# Patient Record
Sex: Male | Born: 1955 | Hispanic: No | Marital: Married | Smoking: Never smoker
Health system: Southern US, Community
[De-identification: ages and names within clinical notes are randomized; demographics above are authoritative.]

## PROBLEM LIST (undated history)

## (undated) DIAGNOSIS — J95851 Ventilator associated pneumonia: Secondary | ICD-10-CM

## (undated) DIAGNOSIS — J9621 Acute and chronic respiratory failure with hypoxia: Secondary | ICD-10-CM

## (undated) DIAGNOSIS — I82409 Acute embolism and thrombosis of unspecified deep veins of unspecified lower extremity: Secondary | ICD-10-CM

## (undated) DIAGNOSIS — G4730 Sleep apnea, unspecified: Secondary | ICD-10-CM

## (undated) DIAGNOSIS — F419 Anxiety disorder, unspecified: Secondary | ICD-10-CM

## (undated) DIAGNOSIS — E119 Type 2 diabetes mellitus without complications: Secondary | ICD-10-CM

## (undated) DIAGNOSIS — U071 COVID-19: Secondary | ICD-10-CM

## (undated) DIAGNOSIS — K219 Gastro-esophageal reflux disease without esophagitis: Secondary | ICD-10-CM

## (undated) DIAGNOSIS — I10 Essential (primary) hypertension: Secondary | ICD-10-CM

## (undated) DIAGNOSIS — J80 Acute respiratory distress syndrome: Secondary | ICD-10-CM

## (undated) DIAGNOSIS — K529 Noninfective gastroenteritis and colitis, unspecified: Secondary | ICD-10-CM

## (undated) HISTORY — DX: Sleep apnea, unspecified: G47.30

## (undated) HISTORY — DX: Gastro-esophageal reflux disease without esophagitis: K21.9

## (undated) HISTORY — DX: Anxiety disorder, unspecified: F41.9

## (undated) HISTORY — DX: Acute embolism and thrombosis of unspecified deep veins of unspecified lower extremity: I82.409

## (undated) HISTORY — DX: Type 2 diabetes mellitus without complications: E11.9

## (undated) HISTORY — DX: Noninfective gastroenteritis and colitis, unspecified: K52.9

## (undated) HISTORY — DX: Essential (primary) hypertension: I10

---

## 2011-05-02 DIAGNOSIS — E119 Type 2 diabetes mellitus without complications: Secondary | ICD-10-CM | POA: Insufficient documentation

## 2011-12-19 DIAGNOSIS — G5792 Unspecified mononeuropathy of left lower limb: Secondary | ICD-10-CM | POA: Insufficient documentation

## 2016-10-19 DIAGNOSIS — I10 Essential (primary) hypertension: Secondary | ICD-10-CM | POA: Insufficient documentation

## 2016-11-23 DIAGNOSIS — K5190 Ulcerative colitis, unspecified, without complications: Secondary | ICD-10-CM | POA: Insufficient documentation

## 2020-10-13 ENCOUNTER — Other Ambulatory Visit (HOSPITAL_COMMUNITY): Payer: Medicare Other

## 2020-10-13 ENCOUNTER — Institutional Professional Consult (permissible substitution)
Admission: RE | Admit: 2020-10-13 | Discharge: 2020-11-22 | Disposition: A | Discharge status: Skilled Nursing Facility | Payer: Medicare Other | Attending: Internal Medicine | Admitting: Internal Medicine

## 2020-10-13 DIAGNOSIS — I509 Heart failure, unspecified: Secondary | ICD-10-CM

## 2020-10-13 DIAGNOSIS — J80 Acute respiratory distress syndrome: Secondary | ICD-10-CM | POA: Diagnosis present

## 2020-10-13 DIAGNOSIS — M25552 Pain in left hip: Secondary | ICD-10-CM

## 2020-10-13 DIAGNOSIS — R52 Pain, unspecified: Secondary | ICD-10-CM

## 2020-10-13 DIAGNOSIS — Z8709 Personal history of other diseases of the respiratory system: Secondary | ICD-10-CM

## 2020-10-13 DIAGNOSIS — J9690 Respiratory failure, unspecified, unspecified whether with hypoxia or hypercapnia: Secondary | ICD-10-CM

## 2020-10-13 DIAGNOSIS — U071 COVID-19: Secondary | ICD-10-CM

## 2020-10-13 DIAGNOSIS — J9621 Acute and chronic respiratory failure with hypoxia: Secondary | ICD-10-CM | POA: Diagnosis present

## 2020-10-13 DIAGNOSIS — J95851 Ventilator associated pneumonia: Secondary | ICD-10-CM | POA: Diagnosis present

## 2020-10-13 DIAGNOSIS — Z931 Gastrostomy status: Secondary | ICD-10-CM

## 2020-10-13 DIAGNOSIS — K567 Ileus, unspecified: Secondary | ICD-10-CM

## 2020-10-13 DIAGNOSIS — R0902 Hypoxemia: Secondary | ICD-10-CM

## 2020-10-13 DIAGNOSIS — N201 Calculus of ureter: Secondary | ICD-10-CM

## 2020-10-13 DIAGNOSIS — J189 Pneumonia, unspecified organism: Secondary | ICD-10-CM

## 2020-10-13 DIAGNOSIS — M25551 Pain in right hip: Secondary | ICD-10-CM

## 2020-10-13 HISTORY — DX: Ventilator associated pneumonia: J95.851

## 2020-10-13 HISTORY — DX: COVID-19: U07.1

## 2020-10-13 HISTORY — DX: Acute respiratory distress syndrome: J80

## 2020-10-13 HISTORY — DX: Acute and chronic respiratory failure with hypoxia: J96.21

## 2020-10-13 LAB — BLOOD GAS, ARTERIAL
Acid-Base Excess: 10 mmol/L — ABNORMAL HIGH (ref 0.0–2.0)
Bicarbonate: 36.3 mmol/L — ABNORMAL HIGH (ref 20.0–28.0)
FIO2: 75
O2 Saturation: 92 %
Patient temperature: 37.7
pCO2 arterial: 76.5 mmHg (ref 32.0–48.0)
pH, Arterial: 7.302 — ABNORMAL LOW (ref 7.350–7.450)
pO2, Arterial: 76.4 mmHg — ABNORMAL LOW (ref 83.0–108.0)

## 2020-10-14 ENCOUNTER — Other Ambulatory Visit (HOSPITAL_COMMUNITY): Payer: Medicare Other

## 2020-10-14 LAB — CBC
HCT: 35.1 % — ABNORMAL LOW (ref 39.0–52.0)
Hemoglobin: 10.5 g/dL — ABNORMAL LOW (ref 13.0–17.0)
MCH: 26.6 pg (ref 26.0–34.0)
MCHC: 29.9 g/dL — ABNORMAL LOW (ref 30.0–36.0)
MCV: 88.9 fL (ref 80.0–100.0)
Platelets: 276 10*3/uL (ref 150–400)
RBC: 3.95 MIL/uL — ABNORMAL LOW (ref 4.22–5.81)
RDW: 14.8 % (ref 11.5–15.5)
WBC: 13.9 10*3/uL — ABNORMAL HIGH (ref 4.0–10.5)
nRBC: 0 % (ref 0.0–0.2)

## 2020-10-14 LAB — BASIC METABOLIC PANEL
Anion gap: 10 (ref 5–15)
BUN: 20 mg/dL (ref 8–23)
CO2: 34 mmol/L — ABNORMAL HIGH (ref 22–32)
Calcium: 9.3 mg/dL (ref 8.9–10.3)
Chloride: 97 mmol/L — ABNORMAL LOW (ref 98–111)
Creatinine, Ser: 0.75 mg/dL (ref 0.61–1.24)
GFR, Estimated: >60 mL/min (ref >60)
Glucose, Bld: 175 mg/dL — ABNORMAL HIGH (ref 70–99)
Potassium: 4.2 mmol/L (ref 3.5–5.1)
Sodium: 141 mmol/L (ref 135–145)

## 2020-10-14 LAB — TRIGLYCERIDES: Triglycerides: 171 mg/dL — ABNORMAL HIGH (ref <150)

## 2020-10-15 ENCOUNTER — Other Ambulatory Visit (HOSPITAL_COMMUNITY): Payer: Medicare Other

## 2020-10-15 ENCOUNTER — Encounter: Payer: Self-pay | Admitting: Internal Medicine

## 2020-10-15 DIAGNOSIS — J9621 Acute and chronic respiratory failure with hypoxia: Secondary | ICD-10-CM | POA: Diagnosis present

## 2020-10-15 DIAGNOSIS — J95851 Ventilator associated pneumonia: Secondary | ICD-10-CM | POA: Diagnosis present

## 2020-10-15 DIAGNOSIS — J80 Acute respiratory distress syndrome: Secondary | ICD-10-CM | POA: Diagnosis present

## 2020-10-15 DIAGNOSIS — U071 COVID-19: Secondary | ICD-10-CM | POA: Diagnosis present

## 2020-10-15 NOTE — Consult Note (Signed)
Pulmonary Critical Care Medicine [facility] [place]  PULMONARY SERVICE  Date of Service: 10/15/2020  PULMONARY CRITICAL CARE Jamesrobert Ohanesian  OEU:[id number]  DOB: 20-Oct-1955   DOA: 10/13/2020  Referring Physician: Carron Curie, MD  Collin Robertson is a 65 y.o. male seen for follow up of Acute on Chronic Respiratory Failure. Patient has multiple medical problems including hypertension diabetes sleep apnea who presents to the hospital because of increasing shortness of breath.  The patient at the time of evaluation was found to be Covid positive and had been not vaccinated.  Patient was started on remdesivir as well as Decadron.  Patient however decompensated had to be intubated placed on mechanical ventilation.  Subsequently attempts to wean failed and tracheostomy was performed now presents to our hospital for further management and weaning  Review of Systems:  ROS performed and is unremarkable other than noted above.  Past medical history: Hypertension Diabetes Sleep apnea COVID-19  Past surgical history: Tracheostomy PEG  Social history: Noncontributory  Family history: Noncontributory to the present illness  Medications: Reviewed on Rounds  Physical Exam:  Vitals: Temperature is 98.2 pulse 102 respiratory 28 blood pressure is 137/81 saturations 95%  Ventilator Settings on assist control FiO2 60% tidal volume 500 PEEP of 5  . General: Comfortable at this time . Eyes: Grossly normal lids, irises & conjunctiva . ENT: grossly tongue is normal . Neck: no obvious mass . Cardiovascular: S1-S2 normal no gallop or rub . Respiratory: Scattered coarse rhonchi are noted bilaterally. . Abdomen: Soft nontender . Skin: no rash seen on limited exam . Musculoskeletal: not rigid . Psychiatric:unable to assess . Neurologic: no seizure no involuntary movements         Labs on Admission:  Basic Metabolic Panel: Recent Labs  Lab 10/14/20 0413  NA 141  K  4.2  CL 97*  CO2 34*  GLUCOSE 175*  BUN 20  CREATININE 0.75  CALCIUM 9.3    Recent Labs  Lab 10/13/20 2210  PHART 7.302*  PCO2ART 76.5*  PO2ART 76.4*  HCO3 36.3*  O2SAT 92.0    Liver Function Tests: No results for input(s): AST, ALT, ALKPHOS, BILITOT, PROT, ALBUMIN in the last 168 hours. No results for input(s): LIPASE, AMYLASE in the last 168 hours. No results for input(s): AMMONIA in the last 168 hours.  CBC: Recent Labs  Lab 10/14/20 0413  WBC 13.9*  HGB 10.5*  HCT 35.1*  MCV 88.9  PLT 276    Cardiac Enzymes: No results for input(s): CKTOTAL, CKMB, CKMBINDEX, TROPONINI in the last 168 hours.  BNP (last 3 results) No results for input(s): BNP in the last 8760 hours.  ProBNP (last 3 results) No results for input(s): PROBNP in the last 8760 hours.   Radiological Exams on Admission: DG ABDOMEN PEG TUBE LOCATION  Result Date: 10/13/2020 CLINICAL DATA:  Peg tube placement EXAM: ABDOMEN - 1 VIEW COMPARISON:  None. FINDINGS: Gastrostomy tube projects over the proximal stomach. Contrast pooling within the fundus. No gross extravasation is seen. IMPRESSION: Gastrostomy tube projects over the proximal stomach. No gross extravasation. Electronically Signed   By: Jasmine Pang M.D.   On: 10/13/2020 23:31   DG CHEST PORT 1 VIEW  Result Date: 10/14/2020 CLINICAL DATA:  Respiratory failure EXAM: PORTABLE CHEST 1 VIEW COMPARISON:  None. FINDINGS: Tracheostomy tube with tip centered over the upper thoracic trachea. Left upper extremity PICC with tip overlying the superior cavoatrial junction. The cardiac silhouette is partially obscured but appears enlarged, likely accentuated  by technique. Low lung volumes with diffuse interstitial opacities and bibasilar airspace opacities. The visualized skeletal structures are unremarkable. IMPRESSION: 1. Low lung volumes with diffuse interstitial opacities and bibasilar airspace opacities, most consistent with pulmonary edema. Multifocal  infection is not excluded. Electronically Signed   By: Maudry Mayhew MD   On: 10/14/2020 14:41    Assessment/Plan Active Problems:   Acute on chronic respiratory failure with hypoxia (HCC)   COVID-19 virus infection   Ventilator associated pneumonia (HCC)   Acute respiratory distress syndrome (ARDS) due to COVID-19 virus (HCC)   1. Acute on chronic respiratory failure hypoxia patient has been gradually weaning trying to monitor the patient's secretions this remains a limiting factor right now. 2. COVID-19 virus infection in recovery we will continue to monitor closely. 3. Ventilator associated pneumonia has been treated on antibiotics and cefepime 4. ARDS secondary to COVID-19 slow to resolution patient has residual pulmonary deficits noted  I have personally seen and evaluated the patient, evaluated laboratory and imaging results, formulated the assessment and plan and placed orders. The Patient requires high complexity decision making with multiple systems involvement.  Case was discussed on Rounds with the Respiratory Therapy Director and the Respiratory staff Time Spent  Yevonne Pax, MD [facility] Pulmonary Critical Care Medicine Sleep Medicine

## 2020-10-16 DIAGNOSIS — J95851 Ventilator associated pneumonia: Secondary | ICD-10-CM | POA: Diagnosis not present

## 2020-10-16 DIAGNOSIS — U071 COVID-19: Secondary | ICD-10-CM | POA: Diagnosis not present

## 2020-10-16 DIAGNOSIS — J9621 Acute and chronic respiratory failure with hypoxia: Secondary | ICD-10-CM | POA: Diagnosis not present

## 2020-10-16 DIAGNOSIS — J80 Acute respiratory distress syndrome: Secondary | ICD-10-CM | POA: Diagnosis not present

## 2020-10-16 LAB — BASIC METABOLIC PANEL
Anion gap: 9 (ref 5–15)
BUN: 34 mg/dL — ABNORMAL HIGH (ref 8–23)
CO2: 36 mmol/L — ABNORMAL HIGH (ref 22–32)
Calcium: 9.2 mg/dL (ref 8.9–10.3)
Chloride: 95 mmol/L — ABNORMAL LOW (ref 98–111)
Creatinine, Ser: 0.8 mg/dL (ref 0.61–1.24)
GFR, Estimated: >60 mL/min (ref >60)
Glucose, Bld: 216 mg/dL — ABNORMAL HIGH (ref 70–99)
Potassium: 4.2 mmol/L (ref 3.5–5.1)
Sodium: 140 mmol/L (ref 135–145)

## 2020-10-16 LAB — POTASSIUM: Potassium: 5.5 mmol/L — ABNORMAL HIGH (ref 3.5–5.1)

## 2020-10-16 LAB — MAGNESIUM: Magnesium: 1.9 mg/dL (ref 1.7–2.4)

## 2020-10-16 NOTE — Progress Notes (Signed)
Pulmonary Critical Care Medicine [facility] [place]   PULMONARY CRITICAL CARE SERVICE  PROGRESS NOTE  Date of Service: 10/16/2020  Collin Robertson  FVC:[id number]  DOB: [birth year]   DOA: 10/13/2020  Referring Physician: Carron Curie, MD  HPI: Collin Robertson is a 65 y.o. male seen for follow up of Acute on Chronic Respiratory Failure.  Patient right now is on full support had some issues with bleeding around the tracheostomy now seems to be a little bit better  Medications: Reviewed on Rounds  Physical Exam:  Vitals: Temperature 98.1 pulse 110 respiratory rate is 31 blood pressure 149/94 saturations 95%  Ventilator Settings on assist control FiO2 60% tidal volume 500 PEEP 5  . General: Comfortable at this time . Eyes: Grossly normal lids, irises & conjunctiva . ENT: grossly tongue is normal . Neck: no obvious mass . Cardiovascular: S1 S2 normal no gallop . Respiratory: Scattered rhonchi coarse breath sounds . Abdomen: soft . Skin: no rash seen on limited exam . Musculoskeletal: not rigid . Psychiatric:unable to assess . Neurologic: no seizure no involuntary movements         Lab Data:   Basic Metabolic Panel: Recent Labs  Lab 10/14/20 0413  NA 141  K 4.2  CL 97*  CO2 34*  GLUCOSE 175*  BUN 20  CREATININE 0.75  CALCIUM 9.3    ABG: Recent Labs  Lab 10/13/20 2210  PHART 7.302*  PCO2ART 76.5*  PO2ART 76.4*  HCO3 36.3*  O2SAT 92.0    Liver Function Tests: No results for input(s): AST, ALT, ALKPHOS, BILITOT, PROT, ALBUMIN in the last 168 hours. No results for input(s): LIPASE, AMYLASE in the last 168 hours. No results for input(s): AMMONIA in the last 168 hours.  CBC: Recent Labs  Lab 10/14/20 0413  WBC 13.9*  HGB 10.5*  HCT 35.1*  MCV 88.9  PLT 276    Cardiac Enzymes: No results for input(s): CKTOTAL, CKMB, CKMBINDEX, TROPONINI in the last 168 hours.  BNP (last 3 results) No results for input(s): BNP in the last 8760 hours.  ProBNP  (last 3 results) No results for input(s): PROBNP in the last 8760 hours.  Radiological Exams: DG CHEST PORT 1 VIEW  Result Date: 10/14/2020 CLINICAL DATA:  Respiratory failure EXAM: PORTABLE CHEST 1 VIEW COMPARISON:  None. FINDINGS: Tracheostomy tube with tip centered over the upper thoracic trachea. Left upper extremity PICC with tip overlying the superior cavoatrial junction. The cardiac silhouette is partially obscured but appears enlarged, likely accentuated by technique. Low lung volumes with diffuse interstitial opacities and bibasilar airspace opacities. The visualized skeletal structures are unremarkable. IMPRESSION: 1. Low lung volumes with diffuse interstitial opacities and bibasilar airspace opacities, most consistent with pulmonary edema. Multifocal infection is not excluded. Electronically Signed   By: Maudry Mayhew MD   On: 10/14/2020 14:41   DG Abd Portable 1V  Result Date: 10/15/2020 CLINICAL DATA:  Ileus EXAM: PORTABLE ABDOMEN - 1 VIEW COMPARISON:  10/13/2020 FINDINGS: Gastrostomy remains in place. Gas is present throughout the colon suggesting ileus. Contrast fills the rectal region. Small bowel pattern is unremarkable. Patchy bilateral pulmonary infiltrates are appreciable. IMPRESSION: Gas present throughout the colon suggesting ileus. Contrast fills the rectal region. Small bowel pattern is unremarkable. Gastrostomy in place. Electronically Signed   By: Paulina Fusi M.D.   On: 10/15/2020 21:01    Assessment/Plan Active Problems:   Acute on chronic respiratory failure with hypoxia (HCC)   COVID-19 virus infection   Ventilator associated pneumonia (HCC)   Acute  respiratory distress syndrome (ARDS) due to COVID-19 virus (HCC)   1. Acute on chronic respiratory failure hypoxia we will continue with on full support on the ventilator holding off on weaning right now 2. COVID-19 virus infection in recovery 3. Ventilator associated pneumonia treated slow improvement 4. ARDS  treated slow improvement   I have personally seen and evaluated the patient, evaluated laboratory and imaging results, formulated the assessment and plan and placed orders. The Patient requires high complexity decision making with multiple systems involvement.  Rounds were done with the Respiratory Therapy Director and Staff therapists and discussed with nursing staff also.  Yevonne Pax, MD [facility] Pulmonary Critical Care Medicine Sleep Medicine

## 2020-10-17 DIAGNOSIS — J9621 Acute and chronic respiratory failure with hypoxia: Secondary | ICD-10-CM | POA: Diagnosis not present

## 2020-10-17 DIAGNOSIS — J95851 Ventilator associated pneumonia: Secondary | ICD-10-CM | POA: Diagnosis not present

## 2020-10-17 DIAGNOSIS — J80 Acute respiratory distress syndrome: Secondary | ICD-10-CM | POA: Diagnosis not present

## 2020-10-17 DIAGNOSIS — U071 COVID-19: Secondary | ICD-10-CM | POA: Diagnosis not present

## 2020-10-17 LAB — BASIC METABOLIC PANEL
Anion gap: 7 (ref 5–15)
BUN: 31 mg/dL — ABNORMAL HIGH (ref 8–23)
CO2: 39 mmol/L — ABNORMAL HIGH (ref 22–32)
Calcium: 9.2 mg/dL (ref 8.9–10.3)
Chloride: 97 mmol/L — ABNORMAL LOW (ref 98–111)
Creatinine, Ser: 0.72 mg/dL (ref 0.61–1.24)
GFR, Estimated: >60 mL/min (ref >60)
Glucose, Bld: 150 mg/dL — ABNORMAL HIGH (ref 70–99)
Potassium: 4.2 mmol/L (ref 3.5–5.1)
Sodium: 143 mmol/L (ref 135–145)

## 2020-10-17 LAB — TRIGLYCERIDES: Triglycerides: 138 mg/dL (ref <150)

## 2020-10-17 NOTE — Progress Notes (Signed)
Pulmonary Critical Care Medicine [facility] [place]   PULMONARY CRITICAL CARE SERVICE  PROGRESS NOTE  Date of Service: 10/17/2020  Pavlos Yon  ZJQ:[id number]  DOB: 02-09-1956   DOA: 10/13/2020  Referring Physician: Carron Curie, MD  HPI: Markise Haymer is a 65 y.o. male seen for follow up of Acute on Chronic Respiratory Failure.  Patient is comfortable right now without distress remains on assist control mode has been on 50% FiO2  Medications: Reviewed on Rounds  Physical Exam:  Vitals: Temperature is 97.8 pulse 88 respiratory 28 blood pressure is 138/79 saturations 100%  Ventilator Settings on assist control FiO2 is 50% PEEP of 7 tidal volume 500  . General: Comfortable at this time . Eyes: Grossly normal lids, irises & conjunctiva . ENT: grossly tongue is normal . Neck: no obvious mass . Cardiovascular: S1 S2 normal no gallop . Respiratory: Scattered rhonchi expansion is equal . Abdomen: soft . Skin: no rash seen on limited exam . Musculoskeletal: not rigid . Psychiatric:unable to assess . Neurologic: no seizure no involuntary movements         Lab Data:   Basic Metabolic Panel: Recent Labs  Lab 10/14/20 0413 10/16/20 1411 10/16/20 1818 10/17/20 0442  NA 141 CANCELLED BY LAB 140 143  K 4.2 CANCELLED BY LAB  5.5* 4.2 4.2  CL 97* CANCELLED BY LAB 95* 97*  CO2 34* CANCELLED BY LAB 36* 39*  GLUCOSE 175* CANCELLED BY LAB 216* 150*  BUN 20 CANCELLED BY LAB 34* 31*  CREATININE 0.75 CANCELLED BY LAB 0.80 0.72  CALCIUM 9.3 CANCELLED BY LAB 9.2 9.2  MG  --  1.9  --   --     ABG: Recent Labs  Lab 10/13/20 2210  PHART 7.302*  PCO2ART 76.5*  PO2ART 76.4*  HCO3 36.3*  O2SAT 92.0    Liver Function Tests: No results for input(s): AST, ALT, ALKPHOS, BILITOT, PROT, ALBUMIN in the last 168 hours. No results for input(s): LIPASE, AMYLASE in the last 168 hours. No results for input(s): AMMONIA in the last 168 hours.  CBC: Recent Labs  Lab  10/14/20 0413  WBC 13.9*  HGB 10.5*  HCT 35.1*  MCV 88.9  PLT 276    Cardiac Enzymes: No results for input(s): CKTOTAL, CKMB, CKMBINDEX, TROPONINI in the last 168 hours.  BNP (last 3 results) No results for input(s): BNP in the last 8760 hours.  ProBNP (last 3 results) No results for input(s): PROBNP in the last 8760 hours.  Radiological Exams: DG Abd Portable 1V  Result Date: 10/15/2020 CLINICAL DATA:  Ileus EXAM: PORTABLE ABDOMEN - 1 VIEW COMPARISON:  10/13/2020 FINDINGS: Gastrostomy remains in place. Gas is present throughout the colon suggesting ileus. Contrast fills the rectal region. Small bowel pattern is unremarkable. Patchy bilateral pulmonary infiltrates are appreciable. IMPRESSION: Gas present throughout the colon suggesting ileus. Contrast fills the rectal region. Small bowel pattern is unremarkable. Gastrostomy in place. Electronically Signed   By: Paulina Fusi M.D.   On: 10/15/2020 21:01    Assessment/Plan Active Problems:   Acute on chronic respiratory failure with hypoxia (HCC)   COVID-19 virus infection   Ventilator associated pneumonia (HCC)   Acute respiratory distress syndrome (ARDS) due to COVID-19 virus (HCC)   1. Acute on chronic respiratory failure hypoxia we will continue with assist control titrate oxygen continue pulmonary toilet. 2. COVID-19 virus infection recovery 3. Ventilator associated pneumonia treated 4. ARDS treated slowly improving we will continue to follow along   I have personally seen and  evaluated the patient, evaluated laboratory and imaging results, formulated the assessment and plan and placed orders. The Patient requires high complexity decision making with multiple systems involvement.  Rounds were done with the Respiratory Therapy Director and Staff therapists and discussed with nursing staff also.  Allyne Gee, MD [facility] Pulmonary Critical Care Medicine Sleep Medicine

## 2020-10-18 DIAGNOSIS — J80 Acute respiratory distress syndrome: Secondary | ICD-10-CM | POA: Diagnosis not present

## 2020-10-18 DIAGNOSIS — J95851 Ventilator associated pneumonia: Secondary | ICD-10-CM | POA: Diagnosis not present

## 2020-10-18 DIAGNOSIS — J9621 Acute and chronic respiratory failure with hypoxia: Secondary | ICD-10-CM | POA: Diagnosis not present

## 2020-10-18 DIAGNOSIS — U071 COVID-19: Secondary | ICD-10-CM | POA: Diagnosis not present

## 2020-10-18 NOTE — Progress Notes (Signed)
Pulmonary Critical Care Medicine [facility] [place]   PULMONARY CRITICAL CARE SERVICE  PROGRESS NOTE  Date of Service: 10/18/2020  Collin Robertson  [id number]  DOB: [birth year]   DOA: 10/13/2020  Referring Physician: Carron Curie, MD  HPI: Collin Robertson is a 65 y.o. male seen for follow up of Acute on Chronic Respiratory Failure.  Patient has a low-grade fever noted at this time otherwise comfortable without distress remains on assist control mode  Medications: Reviewed on Rounds  Physical Exam:  Vitals: Temperature is 99.0 pulse 103 respiratory rate is 30 blood pressure is 136/81 saturations 95%  Ventilator Settings patient is currently on assist control FiO2 45% tidal volume 500 PEEP of 7  . General: Comfortable at this time . Eyes: Grossly normal lids, irises & conjunctiva . ENT: grossly tongue is normal . Neck: no obvious mass . Cardiovascular: S1 S2 normal no gallop . Respiratory: No rhonchi very coarse breath sounds . Abdomen: soft . Skin: no rash seen on limited exam . Musculoskeletal: not rigid . Psychiatric:unable to assess . Neurologic: no seizure no involuntary movements         Lab Data:   Basic Metabolic Panel: Recent Labs  Lab 10/14/20 0413 10/16/20 1411 10/16/20 1818 10/17/20 0442  NA 141 CANCELLED BY LAB 140 143  K 4.2 CANCELLED BY LAB  5.5* 4.2 4.2  CL 97* CANCELLED BY LAB 95* 97*  CO2 34* CANCELLED BY LAB 36* 39*  GLUCOSE 175* CANCELLED BY LAB 216* 150*  BUN 20 CANCELLED BY LAB 34* 31*  CREATININE 0.75 CANCELLED BY LAB 0.80 0.72  CALCIUM 9.3 CANCELLED BY LAB 9.2 9.2  MG  --  1.9  --   --     ABG: Recent Labs  Lab 10/13/20 2210  PHART 7.302*  PCO2ART 76.5*  PO2ART 76.4*  HCO3 36.3*  O2SAT 92.0    Liver Function Tests: No results for input(s): AST, ALT, ALKPHOS, BILITOT, PROT, ALBUMIN in the last 168 hours. No results for input(s): LIPASE, AMYLASE in the last 168 hours. No results for input(s): AMMONIA in the last 168  hours.  CBC: Recent Labs  Lab 10/14/20 0413  WBC 13.9*  HGB 10.5*  HCT 35.1*  MCV 88.9  PLT 276    Cardiac Enzymes: No results for input(s): CKTOTAL, CKMB, CKMBINDEX, TROPONINI in the last 168 hours.  BNP (last 3 results) No results for input(s): BNP in the last 8760 hours.  ProBNP (last 3 results) No results for input(s): PROBNP in the last 8760 hours.  Radiological Exams: No results found.  Assessment/Plan Active Problems:   Acute on chronic respiratory failure with hypoxia (HCC)   COVID-19 virus infection   Ventilator associated pneumonia (HCC)   Acute respiratory distress syndrome (ARDS) due to COVID-19 virus (HCC)   1. Acute on chronic respiratory failure hypoxia patient is comfortable right now on assist control mode supposed to wean on pressure support wean today 2. COVID-19 virus infection recovery we will continue to follow 3. Ventilator associated pneumonia treated 4. ARDS slow improvement   I have personally seen and evaluated the patient, evaluated laboratory and imaging results, formulated the assessment and plan and placed orders. The Patient requires high complexity decision making with multiple systems involvement.  Rounds were done with the Respiratory Therapy Director and Staff therapists and discussed with nursing staff also.  Yevonne Pax, MD [facility] Pulmonary Critical Care Medicine Sleep Medicine

## 2020-10-19 ENCOUNTER — Other Ambulatory Visit (HOSPITAL_COMMUNITY): Payer: Medicare Other

## 2020-10-19 DIAGNOSIS — J95851 Ventilator associated pneumonia: Secondary | ICD-10-CM | POA: Diagnosis not present

## 2020-10-19 DIAGNOSIS — J9621 Acute and chronic respiratory failure with hypoxia: Secondary | ICD-10-CM | POA: Diagnosis not present

## 2020-10-19 DIAGNOSIS — U071 COVID-19: Secondary | ICD-10-CM | POA: Diagnosis not present

## 2020-10-19 DIAGNOSIS — J80 Acute respiratory distress syndrome: Secondary | ICD-10-CM | POA: Diagnosis not present

## 2020-10-19 NOTE — Progress Notes (Signed)
Pulmonary Critical Care Medicine [facility] [place]   PULMONARY CRITICAL CARE SERVICE  PROGRESS NOTE  Date of Service: 10/19/2020  Collin Robertson  [id number]  DOB: [birth year]   DOA: 10/13/2020  Referring Physician: Carron Curie, MD  HPI: Collin Robertson is a 65 y.o. male seen for follow up of Acute on Chronic Respiratory Failure.  Patient is pressure support has been on 40% FiO2 has been weaning on 12/5  Medications: Reviewed on Rounds  Physical Exam:  Vitals: Temperature 97.0 pulse 77 respiratory rate is 16 blood pressure 164/95 saturations 96%  Ventilator Settings on pressure support FiO2 40% pressure 12/5  . General: Comfortable at this time . Eyes: Grossly normal lids, irises & conjunctiva . ENT: grossly tongue is normal . Neck: no obvious mass . Cardiovascular: S1 S2 normal no gallop . Respiratory: Scattered rhonchi expansion is equal . Abdomen: soft . Skin: no rash seen on limited exam . Musculoskeletal: not rigid . Psychiatric:unable to assess . Neurologic: no seizure no involuntary movements         Lab Data:   Basic Metabolic Panel: Recent Labs  Lab 10/14/20 0413 10/16/20 1411 10/16/20 1818 10/17/20 0442  NA 141 CANCELLED BY LAB 140 143  K 4.2 CANCELLED BY LAB  5.5* 4.2 4.2  CL 97* CANCELLED BY LAB 95* 97*  CO2 34* CANCELLED BY LAB 36* 39*  GLUCOSE 175* CANCELLED BY LAB 216* 150*  BUN 20 CANCELLED BY LAB 34* 31*  CREATININE 0.75 CANCELLED BY LAB 0.80 0.72  CALCIUM 9.3 CANCELLED BY LAB 9.2 9.2  MG  --  1.9  --   --     ABG: Recent Labs  Lab 10/13/20 2210  PHART 7.302*  PCO2ART 76.5*  PO2ART 76.4*  HCO3 36.3*  O2SAT 92.0    Liver Function Tests: No results for input(s): AST, ALT, ALKPHOS, BILITOT, PROT, ALBUMIN in the last 168 hours. No results for input(s): LIPASE, AMYLASE in the last 168 hours. No results for input(s): AMMONIA in the last 168 hours.  CBC: Recent Labs  Lab 10/14/20 0413  WBC 13.9*  HGB 10.5*  HCT 35.1*   MCV 88.9  PLT 276    Cardiac Enzymes: No results for input(s): CKTOTAL, CKMB, CKMBINDEX, TROPONINI in the last 168 hours.  BNP (last 3 results) No results for input(s): BNP in the last 8760 hours.  ProBNP (last 3 results) No results for input(s): PROBNP in the last 8760 hours.  Radiological Exams: No results found.  Assessment/Plan Active Problems:   Acute on chronic respiratory failure with hypoxia (HCC)   COVID-19 virus infection   Ventilator associated pneumonia (HCC)   Acute respiratory distress syndrome (ARDS) due to COVID-19 virus (HCC)   1. Acute on chronic respiratory failure with hypoxia we will continue with to wean on pressure support today continue secretion management supportive care. 2. COVID-19 virus infection recovery 3. Ventilator associated pneumonia has been treated we will continue to follow. 4. ARDS treated continue present management   I have personally seen and evaluated the patient, evaluated laboratory and imaging results, formulated the assessment and plan and placed orders. The Patient requires high complexity decision making with multiple systems involvement.  Rounds were done with the Respiratory Therapy Director and Staff therapists and discussed with nursing staff also.  Yevonne Pax, MD [facility] Pulmonary Critical Care Medicine Sleep Medicine

## 2020-10-20 ENCOUNTER — Other Ambulatory Visit (HOSPITAL_COMMUNITY): Payer: Medicare Other

## 2020-10-20 DIAGNOSIS — J80 Acute respiratory distress syndrome: Secondary | ICD-10-CM | POA: Diagnosis not present

## 2020-10-20 DIAGNOSIS — J9621 Acute and chronic respiratory failure with hypoxia: Secondary | ICD-10-CM | POA: Diagnosis not present

## 2020-10-20 DIAGNOSIS — U071 COVID-19: Secondary | ICD-10-CM | POA: Diagnosis not present

## 2020-10-20 DIAGNOSIS — J95851 Ventilator associated pneumonia: Secondary | ICD-10-CM | POA: Diagnosis not present

## 2020-10-20 LAB — BASIC METABOLIC PANEL
Anion gap: 8 (ref 5–15)
BUN: 25 mg/dL — ABNORMAL HIGH (ref 8–23)
CO2: 37 mmol/L — ABNORMAL HIGH (ref 22–32)
Calcium: 9.4 mg/dL (ref 8.9–10.3)
Chloride: 97 mmol/L — ABNORMAL LOW (ref 98–111)
Creatinine, Ser: 0.66 mg/dL (ref 0.61–1.24)
GFR, Estimated: >60 mL/min (ref >60)
Glucose, Bld: 120 mg/dL — ABNORMAL HIGH (ref 70–99)
Potassium: 3.8 mmol/L (ref 3.5–5.1)
Sodium: 142 mmol/L (ref 135–145)

## 2020-10-20 LAB — TRIGLYCERIDES: Triglycerides: 130 mg/dL (ref <150)

## 2020-10-20 NOTE — Progress Notes (Signed)
Pulmonary Critical Care Medicine [facility] [place]   PULMONARY CRITICAL CARE SERVICE  PROGRESS NOTE  Date of Service: 10/20/2020  Collin Robertson  [id number]  DOB: 06-Sep-1955   DOA: 10/13/2020  Referring Physician: Carron Curie, MD  HPI: Collin Robertson is a 65 y.o. male seen for follow up of Acute on Chronic Respiratory Failure.  Patient is on pressure support with a goal today of 8 hours  Medications: Reviewed on Rounds  Physical Exam:  Vitals: Temperature is 96.8 pulse 73 respiratory 25 blood pressure is 137/84 saturations 95%  Ventilator Settings on pressure support FiO2 is 28 pressure 12/7  . General: Comfortable at this time . Eyes: Grossly normal lids, irises & conjunctiva . ENT: grossly tongue is normal . Neck: no obvious mass . Cardiovascular: S1 S2 normal no gallop . Respiratory: No rhonchi no rales are noted at this time . Abdomen: soft . Skin: no rash seen on limited exam . Musculoskeletal: not rigid . Psychiatric:unable to assess . Neurologic: no seizure no involuntary movements         Lab Data:   Basic Metabolic Panel: Recent Labs  Lab 10/14/20 0413 10/16/20 1411 10/16/20 1818 10/17/20 0442 10/20/20 0518  NA 141 CANCELLED BY LAB 140 143 142  K 4.2 CANCELLED BY LAB  5.5* 4.2 4.2 3.8  CL 97* CANCELLED BY LAB 95* 97* 97*  CO2 34* CANCELLED BY LAB 36* 39* 37*  GLUCOSE 175* CANCELLED BY LAB 216* 150* 120*  BUN 20 CANCELLED BY LAB 34* 31* 25*  CREATININE 0.75 CANCELLED BY LAB 0.80 0.72 0.66  CALCIUM 9.3 CANCELLED BY LAB 9.2 9.2 9.4  MG  --  1.9  --   --   --     ABG: Recent Labs  Lab 10/13/20 2210  PHART 7.302*  PCO2ART 76.5*  PO2ART 76.4*  HCO3 36.3*  O2SAT 92.0    Liver Function Tests: No results for input(s): AST, ALT, ALKPHOS, BILITOT, PROT, ALBUMIN in the last 168 hours. No results for input(s): LIPASE, AMYLASE in the last 168 hours. No results for input(s): AMMONIA in the last 168 hours.  CBC: Recent Labs  Lab  10/14/20 0413  WBC 13.9*  HGB 10.5*  HCT 35.1*  MCV 88.9  PLT 276    Cardiac Enzymes: No results for input(s): CKTOTAL, CKMB, CKMBINDEX, TROPONINI in the last 168 hours.  BNP (last 3 results) No results for input(s): BNP in the last 8760 hours.  ProBNP (last 3 results) No results for input(s): PROBNP in the last 8760 hours.  Radiological Exams: DG Abd 1 View  Result Date: 10/19/2020 CLINICAL DATA:  Ileus EXAM: ABDOMEN - 1 VIEW COMPARISON:  10/15/2020 FINDINGS: Nonobstructive bowel gas pattern. No organomegaly or free air. Gastrostomy tube projects over the left upper quadrant. No acute bony abnormality. IMPRESSION: Nonobstructive bowel gas pattern.  No evidence of ileus currently. Electronically Signed   By: Charlett Nose M.D.   On: 10/19/2020 11:09    Assessment/Plan Active Problems:   Acute on chronic respiratory failure with hypoxia (HCC)   COVID-19 virus infection   Ventilator associated pneumonia (HCC)   Acute respiratory distress syndrome (ARDS) due to COVID-19 virus (HCC)   1. Acute on chronic respiratory failure hypoxia continue to wean on pressure support goal of 8 hours 2. COVID-19 virus infection recovery 3. Ventilator associated pneumonia treated 4. ARDS improving   I have personally seen and evaluated the patient, evaluated laboratory and imaging results, formulated the assessment and plan and placed orders. The Patient requires  high complexity decision making with multiple systems involvement.  Rounds were done with the Respiratory Therapy Director and Staff therapists and discussed with nursing staff also.  Allyne Gee, MD [facility] Pulmonary Critical Care Medicine Sleep Medicine

## 2020-10-21 DIAGNOSIS — J95851 Ventilator associated pneumonia: Secondary | ICD-10-CM | POA: Diagnosis not present

## 2020-10-21 DIAGNOSIS — J9621 Acute and chronic respiratory failure with hypoxia: Secondary | ICD-10-CM | POA: Diagnosis not present

## 2020-10-21 DIAGNOSIS — J80 Acute respiratory distress syndrome: Secondary | ICD-10-CM | POA: Diagnosis not present

## 2020-10-21 DIAGNOSIS — U071 COVID-19: Secondary | ICD-10-CM | POA: Diagnosis not present

## 2020-10-21 NOTE — Progress Notes (Signed)
Pulmonary Critical Care Medicine [facility] [place]   PULMONARY CRITICAL CARE SERVICE  PROGRESS NOTE  Date of Service: 10/21/2020  Collin Robertson  LGX:[id number]  DOB: 1955-09-25   DOA: 10/13/2020  Referring Physician: Carron Curie, MD  HPI: Collin Robertson is a 65 y.o. male seen for follow up of Acute on Chronic Respiratory Failure.  Patient did about 8 hours yesterday on pressure support trial advance for 12 hours today  Medications: Reviewed on Rounds  Physical Exam:  Vitals: Temperature is 97.4 pulse 89 respiratory 32 blood pressure is 185/99 saturations 95  Ventilator Settings on pressure support 12/7 FiO2 40%  . General: Comfortable at this time . Eyes: Grossly normal lids, irises & conjunctiva . ENT: grossly tongue is normal . Neck: no obvious mass . Cardiovascular: S1 S2 normal no gallop . Respiratory: No rhonchi no rales are noted at this time . Abdomen: soft . Skin: no rash seen on limited exam . Musculoskeletal: not rigid . Psychiatric:unable to assess . Neurologic: no seizure no involuntary movements         Lab Data:   Basic Metabolic Panel: Recent Labs  Lab 10/16/20 1411 10/16/20 1818 10/17/20 0442 10/20/20 0518  NA CANCELLED BY LAB 140 143 142  K CANCELLED BY LAB  5.5* 4.2 4.2 3.8  CL CANCELLED BY LAB 95* 97* 97*  CO2 CANCELLED BY LAB 36* 39* 37*  GLUCOSE CANCELLED BY LAB 216* 150* 120*  BUN CANCELLED BY LAB 34* 31* 25*  CREATININE CANCELLED BY LAB 0.80 0.72 0.66  CALCIUM CANCELLED BY LAB 9.2 9.2 9.4  MG 1.9  --   --   --     ABG: No results for input(s): PHART, PCO2ART, PO2ART, HCO3, O2SAT in the last 168 hours.  Liver Function Tests: No results for input(s): AST, ALT, ALKPHOS, BILITOT, PROT, ALBUMIN in the last 168 hours. No results for input(s): LIPASE, AMYLASE in the last 168 hours. No results for input(s): AMMONIA in the last 168 hours.  CBC: No results for input(s): WBC, NEUTROABS, HGB, HCT, MCV, PLT in the last 168  hours.  Cardiac Enzymes: No results for input(s): CKTOTAL, CKMB, CKMBINDEX, TROPONINI in the last 168 hours.  BNP (last 3 results) No results for input(s): BNP in the last 8760 hours.  ProBNP (last 3 results) No results for input(s): PROBNP in the last 8760 hours.  Radiological Exams: DG Pelvis 1-2 Views  Result Date: 10/20/2020 CLINICAL DATA:  Bilateral hip pain EXAM: PELVIS - 1-2 VIEW COMPARISON:  None. FINDINGS: SI joints are non widened. Pubic symphysis and rami appear intact. No fracture or malalignment. IMPRESSION: Negative. Electronically Signed   By: Jasmine Pang M.D.   On: 10/20/2020 15:48   DG Abd 1 View  Result Date: 10/19/2020 CLINICAL DATA:  Ileus EXAM: ABDOMEN - 1 VIEW COMPARISON:  10/15/2020 FINDINGS: Nonobstructive bowel gas pattern. No organomegaly or free air. Gastrostomy tube projects over the left upper quadrant. No acute bony abnormality. IMPRESSION: Nonobstructive bowel gas pattern.  No evidence of ileus currently. Electronically Signed   By: Charlett Nose M.D.   On: 10/19/2020 11:09    Assessment/Plan Active Problems:   Acute on chronic respiratory failure with hypoxia (HCC)   COVID-19 virus infection   Ventilator associated pneumonia (HCC)   Acute respiratory distress syndrome (ARDS) due to COVID-19 virus (HCC)   1. Acute on chronic respiratory failure hypoxia we will continue to wean on pressure support as tolerated continue secretion management supportive care 2. COVID-19 virus infection recovery 3. Ventilator  associated pneumonia no change 4. ARDS slow improvement   I have personally seen and evaluated the patient, evaluated laboratory and imaging results, formulated the assessment and plan and placed orders. The Patient requires high complexity decision making with multiple systems involvement.  Rounds were done with the Respiratory Therapy Director and Staff therapists and discussed with nursing staff also.  Yevonne Pax, MD [facility] Pulmonary Critical  Care Medicine Sleep Medicine

## 2020-10-22 DIAGNOSIS — U071 COVID-19: Secondary | ICD-10-CM | POA: Diagnosis not present

## 2020-10-22 DIAGNOSIS — J9621 Acute and chronic respiratory failure with hypoxia: Secondary | ICD-10-CM | POA: Diagnosis not present

## 2020-10-22 DIAGNOSIS — J80 Acute respiratory distress syndrome: Secondary | ICD-10-CM | POA: Diagnosis not present

## 2020-10-22 DIAGNOSIS — J95851 Ventilator associated pneumonia: Secondary | ICD-10-CM | POA: Diagnosis not present

## 2020-10-22 NOTE — Progress Notes (Signed)
Pulmonary Critical Care Medicine [facility] [place]   PULMONARY CRITICAL CARE SERVICE  PROGRESS NOTE  Date of Service: 10/22/2020  Elwyn Klosinski  ZOX:[id number]  DOB: Sep 14, 1955   DOA: 10/13/2020  Referring Physician: Carron Curie, MD  HPI: Gerrit Rafalski is a 65 y.o. male seen for follow up of Acute on Chronic Respiratory Failure.  Patient is on pressure support right now on 40% FiO2 goal is for 16 hours  Medications: Reviewed on Rounds  Physical Exam:  Vitals: Temperature is 98.8 pulse 78 respiratory rate 27 blood pressure is 140/80 saturations 96  Ventilator Settings on pressure support FiO2 40% pressure 12/7  . General: Comfortable at this time . Eyes: Grossly normal lids, irises & conjunctiva . ENT: grossly tongue is normal . Neck: no obvious mass . Cardiovascular: S1 S2 normal no gallop . Respiratory: Scattered rhonchi noted bilaterally . Abdomen: soft . Skin: no rash seen on limited exam . Musculoskeletal: not rigid . Psychiatric:unable to assess . Neurologic: no seizure no involuntary movements         Lab Data:   Basic Metabolic Panel: Recent Labs  Lab 10/16/20 1411 10/16/20 1818 10/17/20 0442 10/20/20 0518  NA CANCELLED BY LAB 140 143 142  K CANCELLED BY LAB  5.5* 4.2 4.2 3.8  CL CANCELLED BY LAB 95* 97* 97*  CO2 CANCELLED BY LAB 36* 39* 37*  GLUCOSE CANCELLED BY LAB 216* 150* 120*  BUN CANCELLED BY LAB 34* 31* 25*  CREATININE CANCELLED BY LAB 0.80 0.72 0.66  CALCIUM CANCELLED BY LAB 9.2 9.2 9.4  MG 1.9  --   --   --     ABG: No results for input(s): PHART, PCO2ART, PO2ART, HCO3, O2SAT in the last 168 hours.  Liver Function Tests: No results for input(s): AST, ALT, ALKPHOS, BILITOT, PROT, ALBUMIN in the last 168 hours. No results for input(s): LIPASE, AMYLASE in the last 168 hours. No results for input(s): AMMONIA in the last 168 hours.  CBC: No results for input(s): WBC, NEUTROABS, HGB, HCT, MCV, PLT in the last 168  hours.  Cardiac Enzymes: No results for input(s): CKTOTAL, CKMB, CKMBINDEX, TROPONINI in the last 168 hours.  BNP (last 3 results) No results for input(s): BNP in the last 8760 hours.  ProBNP (last 3 results) No results for input(s): PROBNP in the last 8760 hours.  Radiological Exams: DG Pelvis 1-2 Views  Result Date: 10/20/2020 CLINICAL DATA:  Bilateral hip pain EXAM: PELVIS - 1-2 VIEW COMPARISON:  None. FINDINGS: SI joints are non widened. Pubic symphysis and rami appear intact. No fracture or malalignment. IMPRESSION: Negative. Electronically Signed   By: Jasmine Pang M.D.   On: 10/20/2020 15:48    Assessment/Plan Active Problems:   Acute on chronic respiratory failure with hypoxia (HCC)   COVID-19 virus infection   Ventilator associated pneumonia (HCC)   Acute respiratory distress syndrome (ARDS) due to COVID-19 virus (HCC)   1. Acute on chronic respiratory failure hypoxia we will continue to wean pressure support goal of 16 hours 2. COVID-19 virus infection in recovery 3. Ventilator associated pneumonia improving 4. ARDS also slowly improving   I have personally seen and evaluated the patient, evaluated laboratory and imaging results, formulated the assessment and plan and placed orders. The Patient requires high complexity decision making with multiple systems involvement.  Rounds were done with the Respiratory Therapy Director and Staff therapists and discussed with nursing staff also.  Yevonne Pax, MD [facility] Pulmonary Critical Care Medicine Sleep Medicine

## 2020-10-23 DIAGNOSIS — J80 Acute respiratory distress syndrome: Secondary | ICD-10-CM | POA: Diagnosis not present

## 2020-10-23 DIAGNOSIS — J9621 Acute and chronic respiratory failure with hypoxia: Secondary | ICD-10-CM | POA: Diagnosis not present

## 2020-10-23 DIAGNOSIS — U071 COVID-19: Secondary | ICD-10-CM | POA: Diagnosis not present

## 2020-10-23 DIAGNOSIS — J95851 Ventilator associated pneumonia: Secondary | ICD-10-CM | POA: Diagnosis not present

## 2020-10-23 LAB — BASIC METABOLIC PANEL
Anion gap: 7 (ref 5–15)
BUN: 21 mg/dL (ref 8–23)
CO2: 33 mmol/L — ABNORMAL HIGH (ref 22–32)
Calcium: 9.1 mg/dL (ref 8.9–10.3)
Chloride: 93 mmol/L — ABNORMAL LOW (ref 98–111)
Creatinine, Ser: 0.58 mg/dL — ABNORMAL LOW (ref 0.61–1.24)
GFR, Estimated: >60 mL/min (ref >60)
Glucose, Bld: 207 mg/dL — ABNORMAL HIGH (ref 70–99)
Potassium: 4.2 mmol/L (ref 3.5–5.1)
Sodium: 133 mmol/L — ABNORMAL LOW (ref 135–145)

## 2020-10-23 LAB — CBC
HCT: 31.1 % — ABNORMAL LOW (ref 39.0–52.0)
Hemoglobin: 9.6 g/dL — ABNORMAL LOW (ref 13.0–17.0)
MCH: 27 pg (ref 26.0–34.0)
MCHC: 30.9 g/dL (ref 30.0–36.0)
MCV: 87.6 fL (ref 80.0–100.0)
Platelets: 253 10*3/uL (ref 150–400)
RBC: 3.55 MIL/uL — ABNORMAL LOW (ref 4.22–5.81)
RDW: 15.9 % — ABNORMAL HIGH (ref 11.5–15.5)
WBC: 12.8 10*3/uL — ABNORMAL HIGH (ref 4.0–10.5)
nRBC: 0 % (ref 0.0–0.2)

## 2020-10-23 LAB — TRIGLYCERIDES: Triglycerides: 104 mg/dL (ref <150)

## 2020-10-23 NOTE — Progress Notes (Signed)
Pulmonary Critical Care Medicine [facility] [place]   PULMONARY CRITICAL CARE SERVICE  PROGRESS NOTE  Date of Service: 10/23/2020  Santino Kinsella  GHW:[id number]  DOB: 1955-11-25   DOA: 10/13/2020  Referring Physician: Carron Curie, MD  HPI: Tristin Vandeusen is a 65 y.o. male seen for follow up of Acute on Chronic Respiratory Failure.  Patient is on pressure support appears to be comfortable without distress.  The patient was on assist control resting  Medications: Reviewed on Rounds  Physical Exam:  Vitals: Temperature is 97.8 pulse 89 respiratory rate is 29 blood pressure 140/80 saturations 97%  Ventilator Settings on assist control FiO2 is 50% tidal volume 500 switched over to pressure support  . General: Comfortable at this time . Eyes: Grossly normal lids, irises & conjunctiva . ENT: grossly tongue is normal . Neck: no obvious mass . Cardiovascular: S1 S2 normal no gallop . Respiratory: Scattered rhonchi expansion is equal . Abdomen: soft . Skin: no rash seen on limited exam . Musculoskeletal: not rigid . Psychiatric:unable to assess . Neurologic: no seizure no involuntary movements         Lab Data:   Basic Metabolic Panel: Recent Labs  Lab 10/16/20 1411 10/16/20 1818 10/17/20 0442 10/20/20 0518 10/23/20 0435  NA CANCELLED BY LAB 140 143 142 133*  K CANCELLED BY LAB  5.5* 4.2 4.2 3.8 4.2  CL CANCELLED BY LAB 95* 97* 97* 93*  CO2 CANCELLED BY LAB 36* 39* 37* 33*  GLUCOSE CANCELLED BY LAB 216* 150* 120* 207*  BUN CANCELLED BY LAB 34* 31* 25* 21  CREATININE CANCELLED BY LAB 0.80 0.72 0.66 0.58*  CALCIUM CANCELLED BY LAB 9.2 9.2 9.4 9.1  MG 1.9  --   --   --   --     ABG: No results for input(s): PHART, PCO2ART, PO2ART, HCO3, O2SAT in the last 168 hours.  Liver Function Tests: No results for input(s): AST, ALT, ALKPHOS, BILITOT, PROT, ALBUMIN in the last 168 hours. No results for input(s): LIPASE, AMYLASE in the last 168 hours. No results for  input(s): AMMONIA in the last 168 hours.  CBC: Recent Labs  Lab 10/23/20 0435  WBC 12.8*  HGB 9.6*  HCT 31.1*  MCV 87.6  PLT 253    Cardiac Enzymes: No results for input(s): CKTOTAL, CKMB, CKMBINDEX, TROPONINI in the last 168 hours.  BNP (last 3 results) No results for input(s): BNP in the last 8760 hours.  ProBNP (last 3 results) No results for input(s): PROBNP in the last 8760 hours.  Radiological Exams: No results found.  Assessment/Plan Active Problems:   Acute on chronic respiratory failure with hypoxia (HCC)   COVID-19 virus infection   Ventilator associated pneumonia (HCC)   Acute respiratory distress syndrome (ARDS) due to COVID-19 virus (HCC)   1. Acute on chronic respiratory failure hypoxia plan is to continue with weaning on pressure support as tolerated the patient once completed will switch over to T collar. 2. COVID-19 virus infection recovery 3. Ventilator associated pneumonia treated slow improvement 4. ARDS slow improvement we will continue to follow along.    I have personally seen and evaluated the patient, evaluated laboratory and imaging results, formulated the assessment and plan and placed orders. The Patient requires high complexity decision making with multiple systems involvement.  Rounds were done with the Respiratory Therapy Director and Staff therapists and discussed with nursing staff also.  Yevonne Pax, MD [facility] Pulmonary Critical Care Medicine Sleep Medicine

## 2020-10-25 ENCOUNTER — Other Ambulatory Visit (HOSPITAL_COMMUNITY): Payer: Medicare Other

## 2020-10-25 ENCOUNTER — Encounter (HOSPITAL_BASED_OUTPATIENT_CLINIC_OR_DEPARTMENT_OTHER): Payer: Medicare Other

## 2020-10-25 DIAGNOSIS — M7989 Other specified soft tissue disorders: Secondary | ICD-10-CM

## 2020-10-25 LAB — BASIC METABOLIC PANEL
Anion gap: 8 (ref 5–15)
BUN: 22 mg/dL (ref 8–23)
CO2: 33 mmol/L — ABNORMAL HIGH (ref 22–32)
Calcium: 9.3 mg/dL (ref 8.9–10.3)
Chloride: 91 mmol/L — ABNORMAL LOW (ref 98–111)
Creatinine, Ser: 0.6 mg/dL — ABNORMAL LOW (ref 0.61–1.24)
GFR, Estimated: >60 mL/min (ref >60)
Glucose, Bld: 152 mg/dL — ABNORMAL HIGH (ref 70–99)
Potassium: 4.2 mmol/L (ref 3.5–5.1)
Sodium: 132 mmol/L — ABNORMAL LOW (ref 135–145)

## 2020-10-25 LAB — CBC
HCT: 29.1 % — ABNORMAL LOW (ref 39.0–52.0)
Hemoglobin: 9 g/dL — ABNORMAL LOW (ref 13.0–17.0)
MCH: 27.4 pg (ref 26.0–34.0)
MCHC: 30.9 g/dL (ref 30.0–36.0)
MCV: 88.4 fL (ref 80.0–100.0)
Platelets: 201 10*3/uL (ref 150–400)
RBC: 3.29 MIL/uL — ABNORMAL LOW (ref 4.22–5.81)
RDW: 16.5 % — ABNORMAL HIGH (ref 11.5–15.5)
WBC: 11.2 10*3/uL — ABNORMAL HIGH (ref 4.0–10.5)
nRBC: 0 % (ref 0.0–0.2)

## 2020-10-25 LAB — HEMOGLOBIN A1C
Hgb A1c MFr Bld: 8.2 % — ABNORMAL HIGH (ref 4.8–5.6)
Mean Plasma Glucose: 188.64 mg/dL

## 2020-10-25 MED ORDER — IOHEXOL 350 MG/ML SOLN
75.0000 mL | Freq: Once | INTRAVENOUS | Status: AC | PRN
  Administered 2020-10-25: 75 mL via INTRAVENOUS

## 2020-10-25 NOTE — Progress Notes (Signed)
Right lower extremity venous study completed.    RN given results.   Please see CV Proc for preliminary results.   Clint Guy, RVT

## 2020-10-26 DIAGNOSIS — J80 Acute respiratory distress syndrome: Secondary | ICD-10-CM | POA: Diagnosis not present

## 2020-10-26 DIAGNOSIS — J95851 Ventilator associated pneumonia: Secondary | ICD-10-CM | POA: Diagnosis not present

## 2020-10-26 DIAGNOSIS — J9621 Acute and chronic respiratory failure with hypoxia: Secondary | ICD-10-CM | POA: Diagnosis not present

## 2020-10-26 DIAGNOSIS — U071 COVID-19: Secondary | ICD-10-CM | POA: Diagnosis not present

## 2020-10-26 LAB — BASIC METABOLIC PANEL
Anion gap: 8 (ref 5–15)
BUN: 19 mg/dL (ref 8–23)
CO2: 35 mmol/L — ABNORMAL HIGH (ref 22–32)
Calcium: 9.4 mg/dL (ref 8.9–10.3)
Chloride: 91 mmol/L — ABNORMAL LOW (ref 98–111)
Creatinine, Ser: 0.56 mg/dL — ABNORMAL LOW (ref 0.61–1.24)
GFR, Estimated: >60 mL/min (ref >60)
Glucose, Bld: 132 mg/dL — ABNORMAL HIGH (ref 70–99)
Potassium: 4.2 mmol/L (ref 3.5–5.1)
Sodium: 134 mmol/L — ABNORMAL LOW (ref 135–145)

## 2020-10-26 LAB — TRIGLYCERIDES: Triglycerides: 115 mg/dL (ref <150)

## 2020-10-26 NOTE — Progress Notes (Signed)
Pulmonary Critical Care Medicine [facility] [place]   PULMONARY CRITICAL CARE SERVICE  PROGRESS NOTE  Date of Service: 10/26/2020  Collin Robertson  ZOX:[id number]  DOB: August 07, 1956   DOA: 10/13/2020  Referring Physician: Carron Curie, MD  HPI: Collin Robertson is a 65 y.o. male seen for follow up of Acute on Chronic Respiratory Failure.  Patient is on full support on assist control mode right now on 60% FiO2  Medications: Reviewed on Rounds  Physical Exam:  Vitals: Temperature is 99.1 pulse 103 respiratory 29 blood pressure is 125/69 saturations 96%  Ventilator Settings temperature is 99.1 pulse 103 respiratory 29 blood pressure is 125/69 saturations 96%  . General: Comfortable at this time . Eyes: Grossly normal lids, irises & conjunctiva . ENT: grossly tongue is normal . Neck: no obvious mass . Cardiovascular: S1 S2 normal no gallop . Respiratory: No rhonchi no rales are noted at this time . Abdomen: soft . Skin: no rash seen on limited exam . Musculoskeletal: not rigid . Psychiatric:unable to assess . Neurologic: no seizure no involuntary movements         Lab Data:   Basic Metabolic Panel: Recent Labs  Lab 10/20/20 0518 10/23/20 0435 10/25/20 0602 10/26/20 0441  NA 142 133* 132* 134*  K 3.8 4.2 4.2 4.2  CL 97* 93* 91* 91*  CO2 37* 33* 33* 35*  GLUCOSE 120* 207* 152* 132*  BUN 25* 21 22 19   CREATININE 0.66 0.58* 0.60* 0.56*  CALCIUM 9.4 9.1 9.3 9.4    ABG: No results for input(s): PHART, PCO2ART, PO2ART, HCO3, O2SAT in the last 168 hours.  Liver Function Tests: No results for input(s): AST, ALT, ALKPHOS, BILITOT, PROT, ALBUMIN in the last 168 hours. No results for input(s): LIPASE, AMYLASE in the last 168 hours. No results for input(s): AMMONIA in the last 168 hours.  CBC: Recent Labs  Lab 10/23/20 0435 10/25/20 0602  WBC 12.8* 11.2*  HGB 9.6* 9.0*  HCT 31.1* 29.1*  MCV 87.6 88.4  PLT 253 201    Cardiac Enzymes: No results for  input(s): CKTOTAL, CKMB, CKMBINDEX, TROPONINI in the last 168 hours.  BNP (last 3 results) No results for input(s): BNP in the last 8760 hours.  ProBNP (last 3 results) No results for input(s): PROBNP in the last 8760 hours.  Radiological Exams: CT ANGIO CHEST PE W OR WO CONTRAST  Result Date: 10/25/2020 CLINICAL DATA:  Shortness of breath, hypoxia, COVID-19 pneumonia EXAM: CT ANGIOGRAPHY CHEST WITH CONTRAST TECHNIQUE: Multidetector CT imaging of the chest was performed using the standard protocol during bolus administration of intravenous contrast. Multiplanar CT image reconstructions and MIPs were obtained to evaluate the vascular anatomy. CONTRAST:  49mL OMNIPAQUE IOHEXOL 350 MG/ML SOLN COMPARISON:  10/25/2020 FINDINGS: Cardiovascular: This is a technically adequate evaluation of the pulmonary vasculature. No filling defects or pulmonary emboli. The heart is unremarkable without pericardial effusion. No evidence of thoracic aortic aneurysm or dissection. Left-sided PICC, tip within the superior vena cava. Mediastinum/Nodes: No enlarged mediastinal, hilar, or axillary lymph nodes. Thyroid gland, trachea, and esophagus demonstrate no significant findings. Tracheostomy tube tip well above carina. Lungs/Pleura: Extensive interstitial and airspace opacities are seen bilaterally, greatest at the lung bases, compatible with given history of COVID-19 pneumonia. No effusion or pneumothorax. Central airways are patent. Upper Abdomen: Percutaneous gastrostomy tube within the gastric lumen. No acute upper abdominal findings. Musculoskeletal: No acute or destructive bony lesions. Reconstructed images demonstrate no additional findings. Review of the MIP images confirms the above findings. IMPRESSION: 1.  No evidence of pulmonary embolus. 2. Widespread interstitial and alveolar opacities consistent with COVID 19 pneumonia. 3. Support devices as above. Electronically Signed   By: Sharlet Salina M.D.   On: 10/25/2020  21:00   DG Chest Port 1 View  Result Date: 10/25/2020 CLINICAL DATA:  Hypoxia.  COVID-19 pneumonia EXAM: PORTABLE CHEST 1 VIEW COMPARISON:  10/14/2020 FINDINGS: Low volume chest with diffuse hazy and interstitial opacity. Cardiomegaly accentuated by technique. Tracheostomy tube and left PICC in good position. No visible effusion or pneumothorax. IMPRESSION: Stable hardware positioning, low lung volumes, and generalized airspace disease. Electronically Signed   By: Marnee Spring M.D.   On: 10/25/2020 06:12   DG FEMUR 1V RIGHT  Result Date: 10/25/2020 CLINICAL DATA:  Pain right femur. EXAM: RIGHT FEMUR 1 VIEW COMPARISON:  Pelvis 10/20/2020 FINDINGS: Mild degenerative change right hip. Mild degenerative change in the knee. Large inferior osteophyte of the patella. No joint effusion Negative for fracture or bone lesion. IMPRESSION: Mild degenerative change in the hip and knee joint. No acute abnormality. Electronically Signed   By: Marlan Palau M.D.   On: 10/25/2020 10:48   VAS [place] LOWER EXTREMITY VENOUS (DVT)  Result Date: 10/25/2020  Lower Venous DVT Study Indications: Swelling. Other Indications: Covid. Comparison Study: No previous Performing Technologist: Clint Guy RVT  Examination Guidelines: A complete evaluation includes B-mode imaging, spectral Doppler, color Doppler, and power Doppler as needed of all accessible portions of each vessel. Bilateral testing is considered an integral part of a complete examination. Limited examinations for reoccurring indications may be performed as noted. The reflux portion of the exam is performed with the patient in reverse Trendelenburg.  +---------+---------------+---------+-----------+----------+--------------+ RIGHT    CompressibilityPhasicitySpontaneityPropertiesThrombus Aging +---------+---------------+---------+-----------+----------+--------------+ CFV      Full                                                         +---------+---------------+---------+-----------+----------+--------------+ SFJ      Full                                                        +---------+---------------+---------+-----------+----------+--------------+ FV Prox  Full                                                        +---------+---------------+---------+-----------+----------+--------------+ FV Mid   Full                                                        +---------+---------------+---------+-----------+----------+--------------+ FV DistalFull                                                        +---------+---------------+---------+-----------+----------+--------------+ PFV  Full                                                        +---------+---------------+---------+-----------+----------+--------------+ POP      Full                                                        +---------+---------------+---------+-----------+----------+--------------+ PTV      Full                                                        +---------+---------------+---------+-----------+----------+--------------+ PERO     None                                         Acute          +---------+---------------+---------+-----------+----------+--------------+ Soleal   None                                         Acute          +---------+---------------+---------+-----------+----------+--------------+  Summary: RIGHT: - Findings consistent with acute deep vein thrombosis involving the right peroneal veins, and right soleal veins. - No cystic structure found in the popliteal fossa.   *See table(s) above for measurements and observations. Electronically signed by Fabienne Bruns MD on 10/25/2020 at 9:12:02 PM.    Final     Assessment/Plan Active Problems:   Acute on chronic respiratory failure with hypoxia (HCC)   COVID-19 virus infection   Ventilator associated pneumonia (HCC)   Acute  respiratory distress syndrome (ARDS) due to COVID-19 virus (HCC)   1. Acute on chronic respiratory failure hypoxia plan is going to be to continue with full support for now.  Respiratory therapy will continue to assess the mechanics 2. COVID-19 virus infection recovery 3. Ventilator associated pneumonia and supportive care 4. ARDS treated we will continue with supportive care   I have personally seen and evaluated the patient, evaluated laboratory and imaging results, formulated the assessment and plan and placed orders. The Patient requires high complexity decision making with multiple systems involvement.  Rounds were done with the Respiratory Therapy Director and Staff therapists and discussed with nursing staff also.  Yevonne Pax, MD [facility] Pulmonary Critical Care Medicine Sleep Medicine

## 2020-10-27 DIAGNOSIS — J9621 Acute and chronic respiratory failure with hypoxia: Secondary | ICD-10-CM | POA: Diagnosis not present

## 2020-10-27 DIAGNOSIS — J80 Acute respiratory distress syndrome: Secondary | ICD-10-CM | POA: Diagnosis not present

## 2020-10-27 DIAGNOSIS — U071 COVID-19: Secondary | ICD-10-CM | POA: Diagnosis not present

## 2020-10-27 DIAGNOSIS — J95851 Ventilator associated pneumonia: Secondary | ICD-10-CM | POA: Diagnosis not present

## 2020-10-27 LAB — BASIC METABOLIC PANEL
Anion gap: 9 (ref 5–15)
BUN: 12 mg/dL (ref 8–23)
CO2: 32 mmol/L (ref 22–32)
Calcium: 8.9 mg/dL (ref 8.9–10.3)
Chloride: 93 mmol/L — ABNORMAL LOW (ref 98–111)
Creatinine, Ser: 0.55 mg/dL — ABNORMAL LOW (ref 0.61–1.24)
GFR, Estimated: >60 mL/min (ref >60)
Glucose, Bld: 147 mg/dL — ABNORMAL HIGH (ref 70–99)
Potassium: 4 mmol/L (ref 3.5–5.1)
Sodium: 134 mmol/L — ABNORMAL LOW (ref 135–145)

## 2020-10-27 NOTE — Progress Notes (Signed)
Pulmonary Critical Care Medicine [facility] [place]   PULMONARY CRITICAL CARE SERVICE  PROGRESS NOTE  Date of Service: 10/27/2020  Collin Robertson  KNL:[id number]  DOB: April 07, 1956   DOA: 10/13/2020  Referring Physician: Carron Curie, MD  HPI: Collin Robertson is a 65 y.o. male seen for follow up of Acute on Chronic Respiratory Failure.  Patient is on pressure support has been on 50% FiO2 currently on pressure of 12/7  Medications: Reviewed on Rounds  Physical Exam:  Vitals: Temperature is 98.6 pulse 86 respiratory 19 blood pressure is 114/61 saturations 100%  Ventilator Settings on pressure support FiO2 50% pressure 12/7  . General: Comfortable at this time . Eyes: Grossly normal lids, irises & conjunctiva . ENT: grossly tongue is normal . Neck: no obvious mass . Cardiovascular: S1 S2 normal no gallop . Respiratory: No rhonchi very coarse breath sound . Abdomen: soft . Skin: no rash seen on limited exam . Musculoskeletal: not rigid . Psychiatric:unable to assess . Neurologic: no seizure no involuntary movements         Lab Data:   Basic Metabolic Panel: Recent Labs  Lab 10/23/20 0435 10/25/20 0602 10/26/20 0441 10/27/20 0427  NA 133* 132* 134* 134*  K 4.2 4.2 4.2 4.0  CL 93* 91* 91* 93*  CO2 33* 33* 35* 32  GLUCOSE 207* 152* 132* 147*  BUN 21 22 19 12   CREATININE 0.58* 0.60* 0.56* 0.55*  CALCIUM 9.1 9.3 9.4 8.9    ABG: No results for input(s): PHART, PCO2ART, PO2ART, HCO3, O2SAT in the last 168 hours.  Liver Function Tests: No results for input(s): AST, ALT, ALKPHOS, BILITOT, PROT, ALBUMIN in the last 168 hours. No results for input(s): LIPASE, AMYLASE in the last 168 hours. No results for input(s): AMMONIA in the last 168 hours.  CBC: Recent Labs  Lab 10/23/20 0435 10/25/20 0602  WBC 12.8* 11.2*  HGB 9.6* 9.0*  HCT 31.1* 29.1*  MCV 87.6 88.4  PLT 253 201    Cardiac Enzymes: No results for input(s): CKTOTAL, CKMB, CKMBINDEX, TROPONINI in  the last 168 hours.  BNP (last 3 results) No results for input(s): BNP in the last 8760 hours.  ProBNP (last 3 results) No results for input(s): PROBNP in the last 8760 hours.  Radiological Exams: CT ANGIO CHEST PE W OR WO CONTRAST  Result Date: 10/25/2020 CLINICAL DATA:  Shortness of breath, hypoxia, COVID-19 pneumonia EXAM: CT ANGIOGRAPHY CHEST WITH CONTRAST TECHNIQUE: Multidetector CT imaging of the chest was performed using the standard protocol during bolus administration of intravenous contrast. Multiplanar CT image reconstructions and MIPs were obtained to evaluate the vascular anatomy. CONTRAST:  63mL OMNIPAQUE IOHEXOL 350 MG/ML SOLN COMPARISON:  10/25/2020 FINDINGS: Cardiovascular: This is a technically adequate evaluation of the pulmonary vasculature. No filling defects or pulmonary emboli. The heart is unremarkable without pericardial effusion. No evidence of thoracic aortic aneurysm or dissection. Left-sided PICC, tip within the superior vena cava. Mediastinum/Nodes: No enlarged mediastinal, hilar, or axillary lymph nodes. Thyroid gland, trachea, and esophagus demonstrate no significant findings. Tracheostomy tube tip well above carina. Lungs/Pleura: Extensive interstitial and airspace opacities are seen bilaterally, greatest at the lung bases, compatible with given history of COVID-19 pneumonia. No effusion or pneumothorax. Central airways are patent. Upper Abdomen: Percutaneous gastrostomy tube within the gastric lumen. No acute upper abdominal findings. Musculoskeletal: No acute or destructive bony lesions. Reconstructed images demonstrate no additional findings. Review of the MIP images confirms the above findings. IMPRESSION: 1. No evidence of pulmonary embolus. 2. Widespread interstitial  and alveolar opacities consistent with COVID 19 pneumonia. 3. Support devices as above. Electronically Signed   By: Sharlet Salina M.D.   On: 10/25/2020 21:00   DG FEMUR 1V RIGHT  Result Date:  10/25/2020 CLINICAL DATA:  Pain right femur. EXAM: RIGHT FEMUR 1 VIEW COMPARISON:  Pelvis 10/20/2020 FINDINGS: Mild degenerative change right hip. Mild degenerative change in the knee. Large inferior osteophyte of the patella. No joint effusion Negative for fracture or bone lesion. IMPRESSION: Mild degenerative change in the hip and knee joint. No acute abnormality. Electronically Signed   By: Marlan Palau M.D.   On: 10/25/2020 10:48   VAS [place] LOWER EXTREMITY VENOUS (DVT)  Result Date: 10/25/2020  Lower Venous DVT Study Indications: Swelling. Other Indications: Covid. Comparison Study: No previous Performing Technologist: Clint Guy RVT  Examination Guidelines: A complete evaluation includes B-mode imaging, spectral Doppler, color Doppler, and power Doppler as needed of all accessible portions of each vessel. Bilateral testing is considered an integral part of a complete examination. Limited examinations for reoccurring indications may be performed as noted. The reflux portion of the exam is performed with the patient in reverse Trendelenburg.  +---------+---------------+---------+-----------+----------+--------------+ RIGHT    CompressibilityPhasicitySpontaneityPropertiesThrombus Aging +---------+---------------+---------+-----------+----------+--------------+ CFV      Full                                                        +---------+---------------+---------+-----------+----------+--------------+ SFJ      Full                                                        +---------+---------------+---------+-----------+----------+--------------+ FV Prox  Full                                                        +---------+---------------+---------+-----------+----------+--------------+ FV Mid   Full                                                        +---------+---------------+---------+-----------+----------+--------------+ FV DistalFull                                                         +---------+---------------+---------+-----------+----------+--------------+ PFV      Full                                                        +---------+---------------+---------+-----------+----------+--------------+ POP      Full                                                        +---------+---------------+---------+-----------+----------+--------------+  PTV      Full                                                        +---------+---------------+---------+-----------+----------+--------------+ PERO     None                                         Acute          +---------+---------------+---------+-----------+----------+--------------+ Soleal   None                                         Acute          +---------+---------------+---------+-----------+----------+--------------+  Summary: RIGHT: - Findings consistent with acute deep vein thrombosis involving the right peroneal veins, and right soleal veins. - No cystic structure found in the popliteal fossa.   *See table(s) above for measurements and observations. Electronically signed by Fabienne Bruns MD on 10/25/2020 at 9:12:02 PM.    Final     Assessment/Plan Active Problems:   Acute on chronic respiratory failure with hypoxia (HCC)   COVID-19 virus infection   Ventilator associated pneumonia (HCC)   Acute respiratory distress syndrome (ARDS) due to COVID-19 virus (HCC)   1. Acute on chronic respiratory failure with hypoxia we will continue with pressure support patient's been on 50% FiO2 good saturations 2. COVID-19 virus infection recovery 3. Ventilator associated pneumonia treated slow improvement 4. ARDS treated we will continue to follow along.   I have personally seen and evaluated the patient, evaluated laboratory and imaging results, formulated the assessment and plan and placed orders. The Patient requires high complexity decision making with multiple systems  involvement.  Rounds were done with the Respiratory Therapy Director and Staff therapists and discussed with nursing staff also.  Yevonne Pax, MD [facility] Pulmonary Critical Care Medicine Sleep Medicine

## 2020-10-28 DIAGNOSIS — J80 Acute respiratory distress syndrome: Secondary | ICD-10-CM | POA: Diagnosis not present

## 2020-10-28 DIAGNOSIS — J9621 Acute and chronic respiratory failure with hypoxia: Secondary | ICD-10-CM | POA: Diagnosis not present

## 2020-10-28 DIAGNOSIS — J95851 Ventilator associated pneumonia: Secondary | ICD-10-CM | POA: Diagnosis not present

## 2020-10-28 DIAGNOSIS — U071 COVID-19: Secondary | ICD-10-CM | POA: Diagnosis not present

## 2020-10-28 NOTE — Progress Notes (Signed)
Pulmonary Critical Care Medicine [facility] [place]   PULMONARY CRITICAL CARE SERVICE  PROGRESS NOTE  Date of Service: 10/28/2020  Collin Robertson  RCV:[id number]  DOB: 18-Nov-1955   DOA: 10/13/2020  Referring Physician: Carron Curie, MD  HPI: Collin Robertson is a 65 y.o. male seen for follow up of Acute on Chronic Respiratory Failure.  Patient currently is on pressure support has been on a pressure of 12/5 using T collar  Medications: Reviewed on Rounds  Physical Exam:  Vitals: Temperature 98.0 pulse 90 respiratory 24 blood pressure is 127/77 saturations 98%  Ventilator Settings on pressure support 12/5   General: Comfortable at this time  Eyes: Grossly normal lids, irises & conjunctiva  ENT: grossly tongue is normal  Neck: no obvious mass  Cardiovascular: S1 S2 normal no gallop  Respiratory: No rhonchi or rales noted at this time  Abdomen: soft  Skin: no rash seen on limited exam  Musculoskeletal: not rigid  Psychiatric:unable to assess  Neurologic: no seizure no involuntary movements         Lab Data:   Basic Metabolic Panel: Recent Labs  Lab 10/23/20 0435 10/25/20 0602 10/26/20 0441 10/27/20 0427  NA 133* 132* 134* 134*  K 4.2 4.2 4.2 4.0  CL 93* 91* 91* 93*  CO2 33* 33* 35* 32  GLUCOSE 207* 152* 132* 147*  BUN 21 22 19 12   CREATININE 0.58* 0.60* 0.56* 0.55*  CALCIUM 9.1 9.3 9.4 8.9    ABG: No results for input(s): PHART, PCO2ART, PO2ART, HCO3, O2SAT in the last 168 hours.  Liver Function Tests: No results for input(s): AST, ALT, ALKPHOS, BILITOT, PROT, ALBUMIN in the last 168 hours. No results for input(s): LIPASE, AMYLASE in the last 168 hours. No results for input(s): AMMONIA in the last 168 hours.  CBC: Recent Labs  Lab 10/23/20 0435 10/25/20 0602  WBC 12.8* 11.2*  HGB 9.6* 9.0*  HCT 31.1* 29.1*  MCV 87.6 88.4  PLT 253 201    Cardiac Enzymes: No results for input(s): CKTOTAL, CKMB, CKMBINDEX, TROPONINI in the last 168  hours.  BNP (last 3 results) No results for input(s): BNP in the last 8760 hours.  ProBNP (last 3 results) No results for input(s): PROBNP in the last 8760 hours.  Radiological Exams: No results found.  Assessment/Plan Active Problems:   Acute on chronic respiratory failure with hypoxia (HCC)   COVID-19 virus infection   Ventilator associated pneumonia (HCC)   Acute respiratory distress syndrome (ARDS) due to COVID-19 virus (HCC)   1. Acute on chronic respiratory failure hypoxia we will continue with the wean on pressure support 2. COVID-19 virus infection recovery 3. Ventilator associated pneumonia treated improving 4. ARDS slow improvement   I have personally seen and evaluated the patient, evaluated laboratory and imaging results, formulated the assessment and plan and placed orders. The Patient requires high complexity decision making with multiple systems involvement.  Rounds were done with the Respiratory Therapy Director and Staff therapists and discussed with nursing staff also.  12/25/20, MD [facility] Pulmonary Critical Care Medicine Sleep Medicine

## 2020-10-29 DIAGNOSIS — J9621 Acute and chronic respiratory failure with hypoxia: Secondary | ICD-10-CM | POA: Diagnosis not present

## 2020-10-29 DIAGNOSIS — U071 COVID-19: Secondary | ICD-10-CM | POA: Diagnosis not present

## 2020-10-29 DIAGNOSIS — J95851 Ventilator associated pneumonia: Secondary | ICD-10-CM | POA: Diagnosis not present

## 2020-10-29 DIAGNOSIS — J80 Acute respiratory distress syndrome: Secondary | ICD-10-CM | POA: Diagnosis not present

## 2020-10-29 NOTE — Progress Notes (Signed)
Pulmonary Critical Care Medicine [facility] [place]   PULMONARY CRITICAL CARE SERVICE  PROGRESS NOTE  Date of Service: 10/29/2020  Collin Robertson  [id number]:[id number]  DOB: 07-02-1956   DOA: 10/13/2020  Referring Physician: Carron Curie, MD  HPI: Collin Robertson is a 65 y.o. male seen for follow up of Acute on Chronic Respiratory Failure.  Patient is on pressure support currently on 12/5 failed attempt at T collar  Medications: Reviewed on Rounds  Physical Exam:  Vitals: Temperature is 97.5 pulse 77 respiratory 26 blood pressure is 129/76 saturations 98%  Ventilator Settings on pressure support FiO2 50% pressure 12/5  . General: Comfortable at this time . Eyes: Grossly normal lids, irises & conjunctiva . ENT: grossly tongue is normal . Neck: no obvious mass . Cardiovascular: S1 S2 normal no gallop . Respiratory: Scattered rhonchi expansion is equal . Abdomen: soft . Skin: no rash seen on limited exam . Musculoskeletal: not rigid . Psychiatric:unable to assess . Neurologic: no seizure no involuntary movements         Lab Data:   Basic Metabolic Panel: Recent Labs  Lab 10/23/20 0435 10/25/20 0602 10/26/20 0441 10/27/20 0427  NA 133* 132* 134* 134*  K 4.2 4.2 4.2 4.0  CL 93* 91* 91* 93*  CO2 33* 33* 35* 32  GLUCOSE 207* 152* 132* 147*  BUN 21 22 19 12   CREATININE 0.58* 0.60* 0.56* 0.55*  CALCIUM 9.1 9.3 9.4 8.9    ABG: No results for input(s): PHART, PCO2ART, PO2ART, HCO3, O2SAT in the last 168 hours.  Liver Function Tests: No results for input(s): AST, ALT, ALKPHOS, BILITOT, PROT, ALBUMIN in the last 168 hours. No results for input(s): LIPASE, AMYLASE in the last 168 hours. No results for input(s): AMMONIA in the last 168 hours.  CBC: Recent Labs  Lab 10/23/20 0435 10/25/20 0602  WBC 12.8* 11.2*  HGB 9.6* 9.0*  HCT 31.1* 29.1*  MCV 87.6 88.4  PLT 253 201    Cardiac Enzymes: No results for input(s): CKTOTAL, CKMB, CKMBINDEX, TROPONINI in the  last 168 hours.  BNP (last 3 results) No results for input(s): BNP in the last 8760 hours.  ProBNP (last 3 results) No results for input(s): PROBNP in the last 8760 hours.  Radiological Exams: No results found.  Assessment/Plan Active Problems:   Acute on chronic respiratory failure with hypoxia (HCC)   COVID-19 virus infection   Ventilator associated pneumonia (HCC)   Acute respiratory distress syndrome (ARDS) due to COVID-19 virus (HCC)   1. Acute on chronic respiratory failure hypoxia plan is going to be to continue with the wean protocol on pressure support 12/5.  Patient has been having some issues with coughing 2. Is back from doing the T collar 3. COVID-19 virus infection recovery we will continue with supportive care. 4. Ventilator associated pneumonia treated slow improvement 5. ARDS also showing slow improvement   I have personally seen and evaluated the patient, evaluated laboratory and imaging results, formulated the assessment and plan and placed orders. The Patient requires high complexity decision making with multiple systems involvement.  Rounds were done with the Respiratory Therapy Director and Staff therapists and discussed with nursing staff also.  14/5, MD [facility] Pulmonary Critical Care Medicine Sleep Medicine

## 2020-10-30 DIAGNOSIS — J80 Acute respiratory distress syndrome: Secondary | ICD-10-CM | POA: Diagnosis not present

## 2020-10-30 DIAGNOSIS — J95851 Ventilator associated pneumonia: Secondary | ICD-10-CM | POA: Diagnosis not present

## 2020-10-30 DIAGNOSIS — J9621 Acute and chronic respiratory failure with hypoxia: Secondary | ICD-10-CM | POA: Diagnosis not present

## 2020-10-30 DIAGNOSIS — U071 COVID-19: Secondary | ICD-10-CM | POA: Diagnosis not present

## 2020-10-30 NOTE — Progress Notes (Signed)
Pulmonary Critical Care Medicine [facility] [place]   PULMONARY CRITICAL CARE SERVICE  PROGRESS NOTE  Date of Service: 10/30/2020  Collin Robertson  [id number]  DOB: 1955-10-16   DOA: 10/13/2020  Referring Physician: Carron Curie, MD  HPI: Collin Robertson is a 65 y.o. male seen for follow up of Acute on Chronic Respiratory Failure.  Patient currently is off the ventilator on T collar has been on 50% FiO2  Medications: Reviewed on Rounds  Physical Exam:  Vitals: Temperature is 98.0 pulse 85 respiratory 20 blood pressure is 133/80 saturations 96%  Ventilator Settings off the ventilator on T collar 50%  . General: Comfortable at this time . Eyes: Grossly normal lids, irises & conjunctiva . ENT: grossly tongue is normal . Neck: no obvious mass . Cardiovascular: S1 S2 normal no gallop . Respiratory: Scattered rhonchi expansion is equal . Abdomen: soft . Skin: no rash seen on limited exam . Musculoskeletal: not rigid . Psychiatric:unable to assess . Neurologic: no seizure no involuntary movements         Lab Data:   Basic Metabolic Panel: Recent Labs  Lab 10/25/20 0602 10/26/20 0441 10/27/20 0427  NA 132* 134* 134*  K 4.2 4.2 4.0  CL 91* 91* 93*  CO2 33* 35* 32  GLUCOSE 152* 132* 147*  BUN 22 19 12   CREATININE 0.60* 0.56* 0.55*  CALCIUM 9.3 9.4 8.9    ABG: No results for input(s): PHART, PCO2ART, PO2ART, HCO3, O2SAT in the last 168 hours.  Liver Function Tests: No results for input(s): AST, ALT, ALKPHOS, BILITOT, PROT, ALBUMIN in the last 168 hours. No results for input(s): LIPASE, AMYLASE in the last 168 hours. No results for input(s): AMMONIA in the last 168 hours.  CBC: Recent Labs  Lab 10/25/20 0602  WBC 11.2*  HGB 9.0*  HCT 29.1*  MCV 88.4  PLT 201    Cardiac Enzymes: No results for input(s): CKTOTAL, CKMB, CKMBINDEX, TROPONINI in the last 168 hours.  BNP (last 3 results) No results for input(s): BNP in the last 8760  hours.  ProBNP (last 3 results) No results for input(s): PROBNP in the last 8760 hours.  Radiological Exams: No results found.  Assessment/Plan Active Problems:   Acute on chronic respiratory failure with hypoxia (HCC)   COVID-19 virus infection   Ventilator associated pneumonia (HCC)   Acute respiratory distress syndrome (ARDS) due to COVID-19 virus (HCC)   1. Acute on chronic respiratory failure hypoxia we will continue with the T collar titrate oxygen down from 50% of time able to tolerate. 2. COVID-19 virus infection recovery we will continue to follow 3. Ventilator associated pneumonia treated supportive care 4. ARDS at baseline continue to monitor closely   I have personally seen and evaluated the patient, evaluated laboratory and imaging results, formulated the assessment and plan and placed orders. The Patient requires high complexity decision making with multiple systems involvement.  Rounds were done with the Respiratory Therapy Director and Staff therapists and discussed with nursing staff also.  12/25/20, MD [facility] Pulmonary Critical Care Medicine Sleep Medicine

## 2020-10-31 LAB — BLOOD GAS, ARTERIAL
Acid-Base Excess: 8.7 mmol/L — ABNORMAL HIGH (ref 0.0–2.0)
Bicarbonate: 34.4 mmol/L — ABNORMAL HIGH (ref 20.0–28.0)
Drawn by: 164
FIO2: 45
O2 Saturation: 90.5 %
Patient temperature: 36.8
pCO2 arterial: 64.1 mmHg — ABNORMAL HIGH (ref 32.0–48.0)
pH, Arterial: 7.348 — ABNORMAL LOW (ref 7.350–7.450)
pO2, Arterial: 63.8 mmHg — ABNORMAL LOW (ref 83.0–108.0)

## 2020-11-01 ENCOUNTER — Other Ambulatory Visit (HOSPITAL_COMMUNITY): Payer: Medicare Other

## 2020-11-01 DIAGNOSIS — J95851 Ventilator associated pneumonia: Secondary | ICD-10-CM | POA: Diagnosis not present

## 2020-11-01 DIAGNOSIS — J80 Acute respiratory distress syndrome: Secondary | ICD-10-CM | POA: Diagnosis not present

## 2020-11-01 DIAGNOSIS — U071 COVID-19: Secondary | ICD-10-CM | POA: Diagnosis not present

## 2020-11-01 DIAGNOSIS — J9621 Acute and chronic respiratory failure with hypoxia: Secondary | ICD-10-CM | POA: Diagnosis not present

## 2020-11-01 MED ORDER — IOHEXOL 350 MG/ML SOLN
75.0000 mL | Freq: Once | INTRAVENOUS | Status: AC | PRN
  Administered 2020-11-01: 75 mL via INTRAVENOUS

## 2020-11-01 NOTE — Progress Notes (Signed)
Pulmonary Critical Care Medicine [facility] [place]   PULMONARY CRITICAL CARE SERVICE  PROGRESS NOTE  Date of Service: 11/01/2020  Collin Robertson  [id number]  DOB: 04/01/1956   DOA: 10/13/2020  Referring Physician: Carron Curie, MD  HPI: Collin Robertson is a 65 y.o. male seen for follow up of Acute on Chronic Respiratory Failure.  Patient currently is on the ventilator and full support supposed to have a CT scan for evaluation of pulmonary embolus  Medications: Reviewed on Rounds  Physical Exam:  Vitals: Temperature is 97.5 pulse 84 respiratory rate 25 blood pressure is 136/85 saturations 100%  Ventilator Settings on assist control FiO2 55% tidal line 500 PEEP 5  . General: Comfortable at this time . Eyes: Grossly normal lids, irises & conjunctiva . ENT: grossly tongue is normal . Neck: no obvious mass . Cardiovascular: S1 S2 normal no gallop . Respiratory: Scattered rhonchi expansion is equal . Abdomen: soft . Skin: no rash seen on limited exam . Musculoskeletal: not rigid . Psychiatric:unable to assess . Neurologic: no seizure no involuntary movements         Lab Data:   Basic Metabolic Panel: Recent Labs  Lab 10/26/20 0441 10/27/20 0427  NA 134* 134*  K 4.2 4.0  CL 91* 93*  CO2 35* 32  GLUCOSE 132* 147*  BUN 19 12  CREATININE 0.56* 0.55*  CALCIUM 9.4 8.9    ABG: Recent Labs  Lab 10/31/20 1018  PHART 7.348*  PCO2ART 64.1*  PO2ART 63.8*  HCO3 34.4*  O2SAT 90.5    Liver Function Tests: No results for input(s): AST, ALT, ALKPHOS, BILITOT, PROT, ALBUMIN in the last 168 hours. No results for input(s): LIPASE, AMYLASE in the last 168 hours. No results for input(s): AMMONIA in the last 168 hours.  CBC: No results for input(s): WBC, NEUTROABS, HGB, HCT, MCV, PLT in the last 168 hours.  Cardiac Enzymes: No results for input(s): CKTOTAL, CKMB, CKMBINDEX, TROPONINI in the last 168 hours.  BNP (last 3 results) No results for input(s): BNP  in the last 8760 hours.  ProBNP (last 3 results) No results for input(s): PROBNP in the last 8760 hours.  Radiological Exams: No results found.  Assessment/Plan Active Problems:   Acute on chronic respiratory failure with hypoxia (HCC)   COVID-19 virus infection   Ventilator associated pneumonia (HCC)   Acute respiratory distress syndrome (ARDS) due to COVID-19 virus (HCC)   1. Acute on chronic respiratory failure hypoxia plan is to continue with full support until the CT scan is done then we will have respiratory therapy reassess 2. COVID-19 virus infection recovery we will continue to follow along 3. Ventilator associated pneumonia no change continue present management. 4. ARDS treated slow improvement   I have personally seen and evaluated the patient, evaluated laboratory and imaging results, formulated the assessment and plan and placed orders. The Patient requires high complexity decision making with multiple systems involvement.  Rounds were done with the Respiratory Therapy Director and Staff therapists and discussed with nursing staff also.  Yevonne Pax, MD [facility] Pulmonary Critical Care Medicine Sleep Medicine

## 2020-11-02 ENCOUNTER — Other Ambulatory Visit (HOSPITAL_COMMUNITY): Payer: Medicare Other

## 2020-11-02 DIAGNOSIS — J80 Acute respiratory distress syndrome: Secondary | ICD-10-CM | POA: Diagnosis not present

## 2020-11-02 DIAGNOSIS — J95851 Ventilator associated pneumonia: Secondary | ICD-10-CM | POA: Diagnosis not present

## 2020-11-02 DIAGNOSIS — J9621 Acute and chronic respiratory failure with hypoxia: Secondary | ICD-10-CM | POA: Diagnosis not present

## 2020-11-02 DIAGNOSIS — U071 COVID-19: Secondary | ICD-10-CM | POA: Diagnosis not present

## 2020-11-02 LAB — URINALYSIS, ROUTINE W REFLEX MICROSCOPIC
Bilirubin Urine: NEGATIVE
Glucose, UA: NEGATIVE mg/dL
Ketones, ur: NEGATIVE mg/dL
Nitrite: NEGATIVE
Protein, ur: 30 mg/dL — AB
Specific Gravity, Urine: 1.006 (ref 1.005–1.030)
pH: 7 (ref 5.0–8.0)

## 2020-11-02 LAB — BASIC METABOLIC PANEL
Anion gap: 6 (ref 5–15)
Anion gap: 8 (ref 5–15)
BUN: 19 mg/dL (ref 8–23)
BUN: 23 mg/dL (ref 8–23)
CO2: 35 mmol/L — ABNORMAL HIGH (ref 22–32)
CO2: 31 mmol/L (ref 22–32)
Calcium: 9.6 mg/dL (ref 8.9–10.3)
Calcium: 10.2 mg/dL (ref 8.9–10.3)
Chloride: 93 mmol/L — ABNORMAL LOW (ref 98–111)
Chloride: 97 mmol/L — ABNORMAL LOW (ref 98–111)
Creatinine, Ser: 0.6 mg/dL — ABNORMAL LOW (ref 0.61–1.24)
Creatinine, Ser: 0.74 mg/dL (ref 0.61–1.24)
GFR, Estimated: >60 mL/min (ref >60)
GFR, Estimated: >60 mL/min (ref >60)
Glucose, Bld: 126 mg/dL — ABNORMAL HIGH (ref 70–99)
Glucose, Bld: 276 mg/dL — ABNORMAL HIGH (ref 70–99)
Potassium: 4.1 mmol/L (ref 3.5–5.1)
Potassium: 4.8 mmol/L (ref 3.5–5.1)
Sodium: 134 mmol/L — ABNORMAL LOW (ref 135–145)
Sodium: 136 mmol/L (ref 135–145)

## 2020-11-02 LAB — CBC
HCT: 30.6 % — ABNORMAL LOW (ref 39.0–52.0)
HCT: 35.6 % — ABNORMAL LOW (ref 39.0–52.0)
Hemoglobin: 9.9 g/dL — ABNORMAL LOW (ref 13.0–17.0)
Hemoglobin: 10.8 g/dL — ABNORMAL LOW (ref 13.0–17.0)
MCH: 28 pg (ref 26.0–34.0)
MCH: 27.1 pg (ref 26.0–34.0)
MCHC: 32.4 g/dL (ref 30.0–36.0)
MCHC: 30.3 g/dL (ref 30.0–36.0)
MCV: 86.4 fL (ref 80.0–100.0)
MCV: 89.4 fL (ref 80.0–100.0)
Platelets: 151 10*3/uL (ref 150–400)
Platelets: 190 10*3/uL (ref 150–400)
RBC: 3.54 MIL/uL — ABNORMAL LOW (ref 4.22–5.81)
RBC: 3.98 MIL/uL — ABNORMAL LOW (ref 4.22–5.81)
RDW: 17.2 % — ABNORMAL HIGH (ref 11.5–15.5)
RDW: 17.6 % — ABNORMAL HIGH (ref 11.5–15.5)
WBC: 12.7 10*3/uL — ABNORMAL HIGH (ref 4.0–10.5)
WBC: 14.1 10*3/uL — ABNORMAL HIGH (ref 4.0–10.5)
nRBC: 0 % (ref 0.0–0.2)
nRBC: 0 % (ref 0.0–0.2)

## 2020-11-02 LAB — TSH: TSH: 2.283 u[IU]/mL (ref 0.350–4.500)

## 2020-11-02 LAB — MAGNESIUM: Magnesium: 1.9 mg/dL (ref 1.7–2.4)

## 2020-11-02 LAB — TROPONIN I (HIGH SENSITIVITY): Troponin I (High Sensitivity): 71 ng/L — ABNORMAL HIGH

## 2020-11-02 NOTE — Progress Notes (Signed)
Pulmonary Critical Care Medicine [facility] [place]   PULMONARY CRITICAL CARE SERVICE  PROGRESS NOTE  Date of Service: 11/02/2020  Collin Robertson  NLG:[id number]  DOB: February 13, 1956   DOA: 10/13/2020  Referring Physician: Carron Curie, MD  HPI: Collin Robertson is a 65 y.o. male seen for follow up of Acute on Chronic Respiratory Failure.  Patient has been having issues with tachycardia being seen by cardiology right now remains on the ventilator currently is on 50% FiO2.  Medications: Reviewed on Rounds  Physical Exam:  Vitals: Temperature is 98.1 pulse 86 respiratory 20 blood pressure is 114/78 saturations 100%  Ventilator Settings on assist control FiO2 50% tidal volumes 500 PEEP 5  . General: Comfortable at this time . Eyes: Grossly normal lids, irises & conjunctiva . ENT: grossly tongue is normal . Neck: no obvious mass . Cardiovascular: S1 S2 normal no gallop . Respiratory: Scattered rhonchi expansion is equal at this time . Abdomen: soft . Skin: no rash seen on limited exam . Musculoskeletal: not rigid . Psychiatric:unable to assess . Neurologic: no seizure no involuntary movements         Lab Data:   Basic Metabolic Panel: Recent Labs  Lab 10/27/20 0427 11/02/20 0438  NA 134* 134*  K 4.0 4.1  CL 93* 93*  CO2 32 35*  GLUCOSE 147* 126*  BUN 12 19  CREATININE 0.55* 0.60*  CALCIUM 8.9 9.6    ABG: Recent Labs  Lab 10/31/20 1018  PHART 7.348*  PCO2ART 64.1*  PO2ART 63.8*  HCO3 34.4*  O2SAT 90.5    Liver Function Tests: No results for input(s): AST, ALT, ALKPHOS, BILITOT, PROT, ALBUMIN in the last 168 hours. No results for input(s): LIPASE, AMYLASE in the last 168 hours. No results for input(s): AMMONIA in the last 168 hours.  CBC: Recent Labs  Lab 11/02/20 0438  WBC 12.7*  HGB 9.9*  HCT 30.6*  MCV 86.4  PLT 151    Cardiac Enzymes: No results for input(s): CKTOTAL, CKMB, CKMBINDEX, TROPONINI in the last 168 hours.  BNP (last 3  results) No results for input(s): BNP in the last 8760 hours.  ProBNP (last 3 results) No results for input(s): PROBNP in the last 8760 hours.  Radiological Exams: CT ANGIO CHEST PE W OR WO CONTRAST  Result Date: 11/01/2020 CLINICAL DATA:  Shortness of breath. EXAM: CT ANGIOGRAPHY CHEST WITH CONTRAST TECHNIQUE: Multidetector CT imaging of the chest was performed using the standard protocol during bolus administration of intravenous contrast. Multiplanar CT image reconstructions and MIPs were obtained to evaluate the vascular anatomy. CONTRAST:  65mL OMNIPAQUE IOHEXOL 350 MG/ML SOLN COMPARISON:  10/25/2020. FINDINGS: Cardiovascular: Image quality is degraded by respiratory motion and body habitus as well as streak artifact from the upper extremities, limiting the evaluation of lobar, segmental and subsegmental pulmonary arteries. No central pulmonary embolus. Pulmonic trunk and heart are enlarged. No pericardial effusion. Mediastinum/Nodes: No pathologically enlarged mediastinal, hilar or axillary lymph nodes. Esophagus is unremarkable. Lungs/Pleura: Image quality is degraded by respiratory motion. Patchy bilateral ground-glass, mild bronchiectasis and architectural distortion. Consolidation in both lower lobes, increased somewhat from 10/25/2020. Tracheostomy is in place. Airway is otherwise unremarkable. No pleural fluid. Upper Abdomen: Visualized portions of the liver, gallbladder, spleen, pancreas, stomach and bowel are grossly unremarkable. Musculoskeletal: Degenerative changes in the spine. No worrisome lytic or sclerotic lesions. Review of the MIP images confirms the above findings. IMPRESSION: 1. Respiratory motion, body habitus and streak artifact from the arms limit diagnostic quality in the detection of  pulmonary emboli. No central pulmonary embolus. 2. Patchy bilateral ground-glass with slight coarsening and slightly increased bilateral lower lobe consolidation. 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 3. Enlarged pulmonic trunk, indicative of pulmonary arterial hypertension. Electronically Signed   By: Leanna Battles M.D.   On: 11/01/2020 13:26    Assessment/Plan Active Problems:   Acute on chronic respiratory failure with hypoxia (HCC)   COVID-19 virus infection   Ventilator associated pneumonia (HCC)   Acute respiratory distress syndrome (ARDS) due to COVID-19 virus (HCC)   1. Acute on chronic respiratory failure hypoxia continue with the full support on the ventilator right now on assist control mode on 50% FiO2. 2. COVID-19 virus infection recovery phase we will continue to follow along. 3. Ventilator associated pneumonia no change continue supportive care 4. ARDS treated slow improvement   I have personally seen and evaluated the patient, evaluated laboratory and imaging results, formulated the assessment and plan and placed orders. The Patient requires high complexity decision making with multiple systems involvement.  Rounds were done with the Respiratory Therapy Director and Staff therapists and discussed with nursing staff also.  Yevonne Pax, MD [facility] Pulmonary Critical Care Medicine Sleep Medicine

## 2020-11-02 NOTE — Consult Note (Signed)
Referring Physician: Dr. Sharyon Medicus, MD   Collin Robertson is an 65 y.o. male.                       Chief Complaint: Tachycardia  HPI: 65 years old male with PMH of HTN, type 2 DM, Sleep apnea, Obesity and COVID-19 pneumonia has acute on chronic respiratory failure with hypoxia and tachyarrhythmia. Patient denies chest pain. 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  Past Medical History:  Diagnosis Date  . Acute on chronic respiratory failure with hypoxia (HCC)   . Acute respiratory distress syndrome (ARDS) due to COVID-19 virus (HCC)   . COVID-19 virus infection   . Ventilator associated pneumonia (HCC)     Past medical history: Hypertension Diabetes Sleep apnea COVID-19  Past surgical history: Tracheostomy PEG  Social history: Noncontributory  Family history: Noncontributory to the present illness  The histories are not reviewed yet. Please review them in the "History" navigator section and refresh this SmartLink.  No family history on file. Social History:  has no history on file for tobacco use, alcohol use, and drug use.  Allergies: Not on File  No medications prior to admission.  See chart list. Off lisinopril.  Results for orders placed or performed during the hospital encounter of 10/13/20 (from the past 48 hour(s))  CBC     Status: Abnormal   Collection Time: 11/02/20  4:38 AM  Result Value Ref Range   WBC 12.7 (H) 4.0 - 10.5 K/uL   RBC 3.54 (L) 4.22 - 5.81 MIL/uL   Hemoglobin 9.9 (L) 13.0 - 17.0 g/dL   HCT 24.5 (L) 80.9 - 98.3 %   MCV 86.4 80.0 - 100.0 fL   MCH 28.0 26.0 - 34.0 pg   MCHC 32.4 30.0 - 36.0 g/dL   RDW 38.2 (H) 50.5 - 39.7 %   Platelets 151 150 - 400 K/uL   nRBC 0.0 0.0 - 0.2 %    Comment: Performed at [facility], [address] [place], [place] [address]  Basic metabolic panel     Status: Abnormal   Collection Time: 11/02/20  4:38 AM  Result Value Ref Range   Sodium 134 (L) 135 - 145 mmol/L   Potassium 4.1 3.5 - 5.1  mmol/L   Chloride 93 (L) 98 - 111 mmol/L   CO2 35 (H) 22 - 32 mmol/L   Glucose, Bld 126 (H) 70 - 99 mg/dL    Comment: Glucose reference range applies only to samples taken after fasting for at least 8 hours.   BUN 19 8 - 23 mg/dL   Creatinine, Ser 9.37 (L) 0.61 - 1.24 mg/dL   Calcium 9.6 8.9 - 90.2 mg/dL   GFR, Estimated >40 >97 mL/min    Comment: (NOTE) Calculated using the CKD-EPI Creatinine Equation (2021)    Anion gap 6 5 - 15    Comment: Performed at [facility], [address] [place], [place] [address]  Urinalysis, Routine w reflex microscopic     Status: Abnormal   Collection Time: 11/02/20  9:00 AM  Result Value Ref Range   Color, Urine YELLOW YELLOW   APPearance CLEAR CLEAR   Specific Gravity, Urine 1.006 1.005 - 1.030   pH 7.0 5.0 - 8.0   Glucose, UA NEGATIVE NEGATIVE mg/dL   Hgb urine dipstick LARGE (A) NEGATIVE   Bilirubin Urine NEGATIVE NEGATIVE   Ketones, ur NEGATIVE NEGATIVE mg/dL   Protein, ur 30 (A) NEGATIVE mg/dL  Nitrite NEGATIVE NEGATIVE   Leukocytes,Ua SMALL (A) NEGATIVE   RBC / HPF 0-5 0 - 5 RBC/hpf   WBC, UA 6-10 0 - 5 WBC/hpf   Bacteria, UA RARE (A) NONE SEEN   Squamous Epithelial / LPF 0-5 0 - 5   Mucus PRESENT     Comment: Performed at [facility], [address], [place], [place] [address]   CT ANGIO CHEST PE W OR WO CONTRAST  Result Date: 11/01/2020 CLINICAL DATA:  Shortness of breath. EXAM: CT ANGIOGRAPHY CHEST WITH CONTRAST TECHNIQUE: Multidetector CT imaging of the chest was performed using the standard protocol during bolus administration of intravenous contrast. Multiplanar CT image reconstructions and MIPs were obtained to evaluate the vascular anatomy. CONTRAST:  73mL OMNIPAQUE IOHEXOL 350 MG/ML SOLN COMPARISON:  10/25/2020. FINDINGS: Cardiovascular: Image quality is degraded by respiratory motion and body habitus as well as streak artifact from the upper extremities, limiting the evaluation of lobar, segmental and subsegmental  pulmonary arteries. No central pulmonary embolus. Pulmonic trunk and heart are enlarged. No pericardial effusion. Mediastinum/Nodes: No pathologically enlarged mediastinal, hilar or axillary lymph nodes. Esophagus is unremarkable. Lungs/Pleura: Image quality is degraded by respiratory motion. Patchy bilateral ground-glass, mild bronchiectasis and architectural distortion. Consolidation in both lower lobes, increased somewhat from 10/25/2020. Tracheostomy is in place. Airway is otherwise unremarkable. No pleural fluid. Upper Abdomen: Visualized portions of the liver, gallbladder, spleen, pancreas, stomach and bowel are grossly unremarkable. Musculoskeletal: Degenerative changes in the spine. No worrisome lytic or sclerotic lesions. Review of the MIP images confirms the above findings. IMPRESSION: 1. Respiratory motion, body habitus and streak artifact from the arms limit diagnostic quality in the detection of pulmonary emboli. No central pulmonary embolus. 2. Patchy bilateral ground-glass with slight coarsening and slightly increased bilateral lower lobe consolidation. 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 3. Enlarged pulmonic trunk, indicative of pulmonary arterial hypertension. Electronically Signed   By: Leanna Battles M.D.   On: 11/01/2020 13:26    Review Of Systems As per HPI.  P:141, R: 31 BP: 96/67, O2 sat: 99 % on 50% FiO2, 20 A/C and 500 cc TV. There were no vitals taken for this visit. There is no height or weight on file to calculate BMI. General appearance: alert, cooperative, appears stated age and in moderate respiratory distress. Head: Normocephalic, atraumatic. Eyes: Brown eyes, Pale pink conjunctiva, corneas clear.  Neck: No adenopathy, no carotid bruit, no JVD, supple, symmetrical, tracheostomy in place. Resp: Clearing to auscultation bilaterally. Cardio: Regular tachycardic rate and rhythm, S1, S2 normal, II/VI systolic murmur, no click, rub  or gallop GI: Soft, non-tender; bowel sounds normal; no organomegaly. Extremities: No edema, cyanosis or clubbing. Skin: Warm and dry.  Neurologic: Alert and oriented X 3, normal strength.  Assessment/Plan Sinus tachycardia Acute on chronic respiratory failure with hypoxia S/P COVID-19 pneumonia S/P tracheostomy Type 2 DM HLD Anemia, chronic disease/blood loss Obesity H/O HTN  Agree with B-blocker and Calcium channel blocker as tolerated. IV fluids as needed. May need echocardiogram once heart rate is controlled. Check TSH.  Time spent: Review of old records, Lab, x-rays, EKG, other cardiac tests, examination, discussion with patient over 70 minutes.  Ricki Rodriguez, MD  11/02/2020, 11:32 AM

## 2020-11-03 ENCOUNTER — Other Ambulatory Visit (HOSPITAL_BASED_OUTPATIENT_CLINIC_OR_DEPARTMENT_OTHER): Payer: Medicare Other

## 2020-11-03 DIAGNOSIS — J95851 Ventilator associated pneumonia: Secondary | ICD-10-CM | POA: Diagnosis not present

## 2020-11-03 DIAGNOSIS — U071 COVID-19: Secondary | ICD-10-CM | POA: Diagnosis not present

## 2020-11-03 DIAGNOSIS — J9621 Acute and chronic respiratory failure with hypoxia: Secondary | ICD-10-CM | POA: Diagnosis not present

## 2020-11-03 DIAGNOSIS — I219 Acute myocardial infarction, unspecified: Secondary | ICD-10-CM | POA: Diagnosis not present

## 2020-11-03 DIAGNOSIS — J80 Acute respiratory distress syndrome: Secondary | ICD-10-CM | POA: Diagnosis not present

## 2020-11-03 LAB — ECHOCARDIOGRAM COMPLETE
AR max vel: 4.07 cm2
AV Area VTI: 4.4 cm2
AV Area mean vel: 4.13 cm2
AV Mean grad: 6 mmHg
AV Peak grad: 11.7 mmHg
Ao pk vel: 1.71 m/s
Area-P 1/2: 3.89 cm2
S' Lateral: 1.6 cm

## 2020-11-03 LAB — VANCOMYCIN, TROUGH: Vancomycin Tr: 15 ug/mL (ref 15–20)

## 2020-11-03 LAB — URINE CULTURE

## 2020-11-03 LAB — CBC
HCT: 29.4 % — ABNORMAL LOW (ref 39.0–52.0)
Hemoglobin: 9.5 g/dL — ABNORMAL LOW (ref 13.0–17.0)
MCH: 28.4 pg (ref 26.0–34.0)
MCHC: 32.3 g/dL (ref 30.0–36.0)
MCV: 87.8 fL (ref 80.0–100.0)
Platelets: 142 10*3/uL — ABNORMAL LOW (ref 150–400)
RBC: 3.35 MIL/uL — ABNORMAL LOW (ref 4.22–5.81)
RDW: 17.8 % — ABNORMAL HIGH (ref 11.5–15.5)
WBC: 9 10*3/uL (ref 4.0–10.5)
nRBC: 0 % (ref 0.0–0.2)

## 2020-11-03 NOTE — Progress Notes (Signed)
Pulmonary Critical Care Medicine [facility] [place]   PULMONARY CRITICAL CARE SERVICE  PROGRESS NOTE  Date of Service: 11/03/2020  Collin Robertson  VOJ:[id number]  DOB: 1956-03-27   DOA: 10/13/2020  Referring Physician: Carron Curie, MD  HPI: Collin Robertson is a 65 y.o. male seen for follow up of Acute on Chronic Respiratory Failure.  Patient remains on the ventilator on full support reportedly off pressors now  Medications: Reviewed on Rounds  Physical Exam:  Vitals: Temperature 97.0 pulse 94 respiratory 30 blood pressure 125/60 saturations 98%  Ventilator Settings on assist control FiO2 is 50% tidal line 500 PEEP 5  . General: Comfortable at this time . Eyes: Grossly normal lids, irises & conjunctiva . ENT: grossly tongue is normal . Neck: no obvious mass . Cardiovascular: S1 S2 normal no gallop . Respiratory: Scattered rhonchi expansion is equal . Abdomen: soft . Skin: no rash seen on limited exam . Musculoskeletal: not rigid . Psychiatric:unable to assess . Neurologic: no seizure no involuntary movements         Lab Data:   Basic Metabolic Panel: Recent Labs  Lab 11/02/20 0438 11/02/20 1438  NA 134* 136  K 4.1 4.8  CL 93* 97*  CO2 35* 31  GLUCOSE 126* 276*  BUN 19 23  CREATININE 0.60* 0.74  CALCIUM 9.6 10.2  MG  --  1.9    ABG: Recent Labs  Lab 10/31/20 1018  PHART 7.348*  PCO2ART 64.1*  PO2ART 63.8*  HCO3 34.4*  O2SAT 90.5    Liver Function Tests: No results for input(s): AST, ALT, ALKPHOS, BILITOT, PROT, ALBUMIN in the last 168 hours. No results for input(s): LIPASE, AMYLASE in the last 168 hours. No results for input(s): AMMONIA in the last 168 hours.  CBC: Recent Labs  Lab 11/02/20 0438 11/02/20 1438 11/03/20 0718  WBC 12.7* 14.1* 9.0  HGB 9.9* 10.8* 9.5*  HCT 30.6* 35.6* 29.4*  MCV 86.4 89.4 87.8  PLT 151 190 142*    Cardiac Enzymes: No results for input(s): CKTOTAL, CKMB, CKMBINDEX, TROPONINI in the last 168  hours.  BNP (last 3 results) No results for input(s): BNP in the last 8760 hours.  ProBNP (last 3 results) No results for input(s): PROBNP in the last 8760 hours.  Radiological Exams: CT ANGIO CHEST PE W OR WO CONTRAST  Result Date: 11/01/2020 CLINICAL DATA:  Shortness of breath. EXAM: CT ANGIOGRAPHY CHEST WITH CONTRAST TECHNIQUE: Multidetector CT imaging of the chest was performed using the standard protocol during bolus administration of intravenous contrast. Multiplanar CT image reconstructions and MIPs were obtained to evaluate the vascular anatomy. CONTRAST:  33mL OMNIPAQUE IOHEXOL 350 MG/ML SOLN COMPARISON:  10/25/2020. FINDINGS: Cardiovascular: Image quality is degraded by respiratory motion and body habitus as well as streak artifact from the upper extremities, limiting the evaluation of lobar, segmental and subsegmental pulmonary arteries. No central pulmonary embolus. Pulmonic trunk and heart are enlarged. No pericardial effusion. Mediastinum/Nodes: No pathologically enlarged mediastinal, hilar or axillary lymph nodes. Esophagus is unremarkable. Lungs/Pleura: Image quality is degraded by respiratory motion. Patchy bilateral ground-glass, mild bronchiectasis and architectural distortion. Consolidation in both lower lobes, increased somewhat from 10/25/2020. Tracheostomy is in place. Airway is otherwise unremarkable. No pleural fluid. Upper Abdomen: Visualized portions of the liver, gallbladder, spleen, pancreas, stomach and bowel are grossly unremarkable. Musculoskeletal: Degenerative changes in the spine. No worrisome lytic or sclerotic lesions. Review of the MIP images confirms the above findings. IMPRESSION: 1. Respiratory motion, body habitus and streak artifact from the arms limit  diagnostic quality in the detection of pulmonary emboli. No central pulmonary embolus. 2. Patchy bilateral ground-glass with slight coarsening and slightly increased bilateral lower lobe consolidation. 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 3. Enlarged pulmonic trunk, indicative of pulmonary arterial hypertension. Electronically Signed   By: Leanna Battles M.D.   On: 11/01/2020 13:26    Assessment/Plan Active Problems:   Acute on chronic respiratory failure with hypoxia (HCC)   COVID-19 virus infection   Ventilator associated pneumonia (HCC)   Acute respiratory distress syndrome (ARDS) due to COVID-19 virus (HCC)   1. Acute on chronic respiratory failure hypoxia plan is going to be to continue with full support for now patient still has required pressors but is coming off CT scan of the chest was done showing patchy groundglass infiltrates bilaterally indicative of Covid pneumonia 2. COVID-19 virus infection is slow to resolve continues to have pulmonary infiltrates 3. Ventilator associated pneumonia has been treated with antibiotics 4. ARDS very slowly improved we will continue to follow along   I have personally seen and evaluated the patient, evaluated laboratory and imaging results, formulated the assessment and plan and placed orders. The Patient requires high complexity decision making with multiple systems involvement.  Rounds were done with the Respiratory Therapy Director and Staff therapists and discussed with nursing staff also.  Yevonne Pax, MD [facility] Pulmonary Critical Care Medicine Sleep Medicine

## 2020-11-04 ENCOUNTER — Other Ambulatory Visit (HOSPITAL_COMMUNITY): Payer: Medicare Other

## 2020-11-04 DIAGNOSIS — U071 COVID-19: Secondary | ICD-10-CM | POA: Diagnosis not present

## 2020-11-04 DIAGNOSIS — J95851 Ventilator associated pneumonia: Secondary | ICD-10-CM | POA: Diagnosis not present

## 2020-11-04 DIAGNOSIS — J80 Acute respiratory distress syndrome: Secondary | ICD-10-CM | POA: Diagnosis not present

## 2020-11-04 DIAGNOSIS — J9621 Acute and chronic respiratory failure with hypoxia: Secondary | ICD-10-CM | POA: Diagnosis not present

## 2020-11-04 LAB — CBC
HCT: 29.9 % — ABNORMAL LOW (ref 39.0–52.0)
Hemoglobin: 9.8 g/dL — ABNORMAL LOW (ref 13.0–17.0)
MCH: 28.1 pg (ref 26.0–34.0)
MCHC: 32.8 g/dL (ref 30.0–36.0)
MCV: 85.7 fL (ref 80.0–100.0)
Platelets: 156 10*3/uL (ref 150–400)
RBC: 3.49 MIL/uL — ABNORMAL LOW (ref 4.22–5.81)
RDW: 17.5 % — ABNORMAL HIGH (ref 11.5–15.5)
WBC: 10.2 10*3/uL (ref 4.0–10.5)
nRBC: 0 % (ref 0.0–0.2)

## 2020-11-04 LAB — BASIC METABOLIC PANEL
Anion gap: 7 (ref 5–15)
BUN: 18 mg/dL (ref 8–23)
CO2: 29 mmol/L (ref 22–32)
Calcium: 9.3 mg/dL (ref 8.9–10.3)
Chloride: 98 mmol/L (ref 98–111)
Creatinine, Ser: 0.59 mg/dL — ABNORMAL LOW (ref 0.61–1.24)
GFR, Estimated: >60 mL/min (ref >60)
Glucose, Bld: 183 mg/dL — ABNORMAL HIGH (ref 70–99)
Potassium: 4.5 mmol/L (ref 3.5–5.1)
Sodium: 134 mmol/L — ABNORMAL LOW (ref 135–145)

## 2020-11-04 LAB — CULTURE, RESPIRATORY W GRAM STAIN

## 2020-11-04 LAB — MAGNESIUM: Magnesium: 1.8 mg/dL (ref 1.7–2.4)

## 2020-11-04 MED ORDER — IOHEXOL 300 MG/ML  SOLN
100.0000 mL | Freq: Once | INTRAMUSCULAR | Status: AC | PRN
  Administered 2020-11-04: 100 mL via INTRAVENOUS

## 2020-11-04 NOTE — Consult Note (Signed)
Ref: Lorelei Pont, DO   Subjective:  VS stable. Improved heart rate.  Objective:  Vital Signs in the last 24 hours:  P: 77, R: 30, BP: 152/78, O2 sat: 96 % on 50 % FiO2 and spontaneous breathing.  Physical Exam: BP Readings from Last 1 Encounters:  No data found for BP     Wt Readings from Last 1 Encounters:  No data found for Wt    Weight change:  There is no height or weight on file to calculate BMI. HEENT: [place]/AT, Eyes-Brown, Conjunctiva-Pale pink, Sclera-Non-icteric Neck: No JVD, No bruit, Tracheostomy in place. Lungs:  Clearing, Bilateral. Cardiac:  Regular rhythm, normal S1 and S2, no S3. II/VI systolic murmur. Abdomen:  Soft, non-tender. BS present. Extremities:  No edema present. No cyanosis. No clubbing. CNS: AxOx3, Cranial nerves grossly intact, moves all 4 extremities.  Skin: Warm and dry.   Intake/Output from previous day: No intake/output data recorded.    Lab Results: BMET    Component Value Date/Time   NA 136 11/02/2020 1438   NA 134 (L) 11/02/2020 0438   NA 134 (L) 10/27/2020 0427   K 4.8 11/02/2020 1438   K 4.1 11/02/2020 0438   K 4.0 10/27/2020 0427   CL 97 (L) 11/02/2020 1438   CL 93 (L) 11/02/2020 0438   CL 93 (L) 10/27/2020 0427   CO2 31 11/02/2020 1438   CO2 35 (H) 11/02/2020 0438   CO2 32 10/27/2020 0427   GLUCOSE 276 (H) 11/02/2020 1438   GLUCOSE 126 (H) 11/02/2020 0438   GLUCOSE 147 (H) 10/27/2020 0427   BUN 23 11/02/2020 1438   BUN 19 11/02/2020 0438   BUN 12 10/27/2020 0427   CREATININE 0.74 11/02/2020 1438   CREATININE 0.60 (L) 11/02/2020 0438   CREATININE 0.55 (L) 10/27/2020 0427   CALCIUM 10.2 11/02/2020 1438   CALCIUM 9.6 11/02/2020 0438   CALCIUM 8.9 10/27/2020 0427   GFRNONAA >60 11/02/2020 1438   GFRNONAA >60 11/02/2020 0438   GFRNONAA >60 10/27/2020 0427   GFRAA CANCELLED BY LAB 10/16/2020 1411   CBC    Component Value Date/Time   WBC 9.0 11/03/2020 0718   RBC 3.35 (L) 11/03/2020 0718   HGB 9.5 (L) 11/03/2020  0718   HCT 29.4 (L) 11/03/2020 0718   PLT 142 (L) 11/03/2020 0718   MCV 87.8 11/03/2020 0718   MCH 28.4 11/03/2020 0718   MCHC 32.3 11/03/2020 0718   RDW 17.8 (H) 11/03/2020 0718   HEPATIC Function Panel No results for input(s): PROT in the last 8760 hours.  Invalid input(s):  ALBUMIN,  AST,  ALT,  ALKPHOS,  BILIDIR,  IBILI HEMOGLOBIN A1C No components found for: HGA1C,  MPG CARDIAC ENZYMES No results found for: CKTOTAL, CKMB, CKMBINDEX, TROPONINI BNP No results for input(s): PROBNP in the last 8760 hours. TSH Recent Labs    11/02/20 1228  TSH 2.283   CHOLESTEROL No results for input(s): CHOL in the last 8760 hours.  Scheduled Meds: Continuous Infusions: PRN Meds:.  Assessment/Plan: Sinus tachycardia, resolved Acute on chronic respiratory failure with hypoxia S/P COVID-19 pneumonia S/P tracheostomy Type 2 DM HTN HLD Anemia of chronic disease and blood loss Obesity  Plan: Continue medical treatment.   LOS: 0 days   Time spent including chart review, lab review, examination, discussion with patient/Nurse : 30 min   Orpah Cobb  MD  11/04/2020, 10:10 AM

## 2020-11-04 NOTE — Progress Notes (Signed)
Pulmonary Critical Care Medicine [facility] [place]   PULMONARY CRITICAL CARE SERVICE  PROGRESS NOTE  Date of Service: 11/04/2020  Collin Robertson  XVQ:[id number]  DOB: December 09, 1955   DOA: 10/13/2020  Referring Physician: Carron Curie, MD  HPI: Collin Robertson is a 65 y.o. male seen for follow up of Acute on Chronic Respiratory Failure.  Patient at this time is on pressure support has been on 50% FiO2 with a pressure of 12/5  Medications: Reviewed on Rounds  Physical Exam:  Vitals: Temperature is 98.1 pulse 78 respiratory 28 blood pressure is 162/78 saturations 97%  Ventilator Settings on pressure support FiO2 50% pressure 12/5  . General: Comfortable at this time . Eyes: Grossly normal lids, irises & conjunctiva . ENT: grossly tongue is normal . Neck: no obvious mass . Cardiovascular: S1 S2 normal no gallop . Respiratory: No rhonchi no rales are noted at this time . Abdomen: soft . Skin: no rash seen on limited exam . Musculoskeletal: not rigid . Psychiatric:unable to assess . Neurologic: no seizure no involuntary movements         Lab Data:   Basic Metabolic Panel: Recent Labs  Lab 11/02/20 0438 11/02/20 1438  NA 134* 136  K 4.1 4.8  CL 93* 97*  CO2 35* 31  GLUCOSE 126* 276*  BUN 19 23  CREATININE 0.60* 0.74  CALCIUM 9.6 10.2  MG  --  1.9    ABG: Recent Labs  Lab 10/31/20 1018  PHART 7.348*  PCO2ART 64.1*  PO2ART 63.8*  HCO3 34.4*  O2SAT 90.5    Liver Function Tests: No results for input(s): AST, ALT, ALKPHOS, BILITOT, PROT, ALBUMIN in the last 168 hours. No results for input(s): LIPASE, AMYLASE in the last 168 hours. No results for input(s): AMMONIA in the last 168 hours.  CBC: Recent Labs  Lab 11/02/20 0438 11/02/20 1438 11/03/20 0718  WBC 12.7* 14.1* 9.0  HGB 9.9* 10.8* 9.5*  HCT 30.6* 35.6* 29.4*  MCV 86.4 89.4 87.8  PLT 151 190 142*    Cardiac Enzymes: No results for input(s): CKTOTAL, CKMB, CKMBINDEX, TROPONINI in the  last 168 hours.  BNP (last 3 results) No results for input(s): BNP in the last 8760 hours.  ProBNP (last 3 results) No results for input(s): PROBNP in the last 8760 hours.  Radiological Exams: CT ABDOMEN PELVIS W CONTRAST  Result Date: 11/04/2020 CLINICAL DATA:  "Bleeding" EXAM: CT ABDOMEN AND PELVIS WITH CONTRAST TECHNIQUE: Multidetector CT imaging of the abdomen and pelvis was performed using the standard protocol following bolus administration of intravenous contrast. CONTRAST:  OMNIPAQUE IOHEXOL 300 MG/ML  SOLN COMPARISON:  Chest CT from 3 days ago FINDINGS: Lower chest: Known airspace disease in the lower lungs after recent COVID infection. Hepatobiliary: No focal liver abnormality.No evidence of biliary obstruction or stone. Pancreas: Unremarkable. Spleen: Unremarkable. Adrenals/Urinary Tract: Negative adrenals. Left hydronephrosis and upper hydroureter due to a upper ureteral stone which measures 16 x 9 mm. There is left renal cortical thinning suggesting longstanding obstruction. The stone has been seen over the left flank since at least February 2022. 11 mm left lower pole renal calculus. Right lower pole renal calculi with limited assessment due to motion. The largest discrete stone measures 12 mm. A Foley catheter completely decompresses the urinary bladder. Stomach/Bowel: No obstruction. No visible bowel inflammation. A percutaneous gastrostomy tube is in unremarkable position. Vascular/Lymphatic: No acute vascular abnormality. No mass or adenopathy. Reproductive:Unremarkable Other: No ascites or pneumoperitoneum. Musculoskeletal: Calcification surrounds the hip joints, likely from  recent immobility. 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 IMPRESSION: 1. No acute finding. 2. 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 3. Bilateral nephrolithiasis. Electronically Signed   By:  Marnee Spring M.D.   On: 11/04/2020 05:13   ECHOCARDIOGRAM COMPLETE  Result Date: 11/03/2020    ECHOCARDIOGRAM REPORT   Patient Name:   Collin Robertson Date of Exam: 11/03/2020 Medical Rec #:  [id number]     Height:       74.0 in Accession #:    [id number]    Weight:       189.0 lb Date of Birth:  12-04-1955     BSA:          2.122 m Patient Age:    64 years      BP:           139/78 mmHg Patient Gender: M             HR:           91 bpm. Exam Location:  Inpatient Procedure: 2D Echo, 3D Echo, Cardiac Doppler, Color Doppler and Strain Analysis Indications:    Myocardial infarction  History:        Patient has no prior history of Echocardiogram examinations.  Sonographer:    Neomia Dear RDCS Referring Phys: [id number] ALI HIJAZI IMPRESSIONS  1. Left ventricular ejection fraction, by estimation, is 60 to 65%. The left ventricle has normal function. The left ventricle has no regional wall motion abnormalities. There is mild asymmetric left ventricular hypertrophy of the basal-septal segment. Left ventricular diastolic parameters are consistent with Grade I diastolic dysfunction (impaired relaxation).  2. Right ventricular systolic function is normal. The right ventricular size is normal. There is normal pulmonary artery systolic pressure. The estimated right ventricular systolic pressure is 26.4 mmHg.  3. Left atrial size was mildly dilated.  4. The mitral valve is grossly normal. No evidence of mitral valve regurgitation. No evidence of mitral stenosis.  5. The aortic valve is tricuspid. Aortic valve regurgitation is not visualized. No aortic stenosis is present.  6. The inferior vena cava is normal in size with greater than 50% respiratory variability, suggesting right atrial pressure of 3 mmHg. FINDINGS  Left Ventricle: Left ventricular ejection fraction, by estimation, is 60 to 65%. The left ventricle has normal function. The left ventricle has no regional wall motion abnormalities. 3D left ventricular ejection  fraction analysis performed but not reported based on interpreter judgement due to suboptimal quality. The left ventricular internal cavity size was normal in size. There is mild asymmetric left ventricular hypertrophy of the basal-septal segment. Left ventricular diastolic parameters are consistent with Grade I diastolic dysfunction (impaired relaxation). Right Ventricle: The right ventricular size is normal. No increase in right ventricular wall thickness. Right ventricular systolic function is normal. There is normal pulmonary artery systolic pressure. The tricuspid regurgitant velocity is 2.42 m/s, and  with an assumed right atrial pressure of 3 mmHg, the estimated right ventricular systolic pressure is 26.4 mmHg. Left Atrium: Left atrial size was mildly dilated. Right Atrium: Right atrial size was normal in size. Pericardium: Trivial pericardial effusion is present. Presence of pericardial fat pad. Mitral Valve: The mitral valve is grossly normal. No evidence of mitral valve regurgitation. No evidence of mitral valve stenosis. Tricuspid Valve: The tricuspid valve is grossly normal. Tricuspid valve regurgitation is trivial. No evidence of tricuspid stenosis. Aortic Valve: The aortic valve is  tricuspid. Aortic valve regurgitation is not visualized. No aortic stenosis is present. Aortic valve mean gradient measures 6.0 mmHg. Aortic valve peak gradient measures 11.7 mmHg. Aortic valve area, by VTI measures 4.40  cm. Pulmonic Valve: The pulmonic valve was grossly normal. Pulmonic valve regurgitation is not visualized. No evidence of pulmonic stenosis. Aorta: The aortic root and ascending aorta are structurally normal, with no evidence of dilitation. Venous: The right upper pulmonary vein is normal. The inferior vena cava is normal in size with greater than 50% respiratory variability, suggesting right atrial pressure of 3 mmHg. IAS/Shunts: The atrial septum is grossly normal.  LEFT VENTRICLE PLAX 2D LVIDd:          4.20 cm  Diastology LVIDs:         1.60 cm  LV e' medial:    6.74 cm/s LV PW:         0.90 cm  LV E/e' medial:  8.8 LV IVS:        1.30 cm  LV e' lateral:   8.81 cm/s LVOT diam:     2.40 cm  LV E/e' lateral: 6.7 LV SV:         128 LV SV Index:   60 LVOT Area:     4.52 cm                          3D Volume EF:                         3D EF:        58 % RIGHT VENTRICLE RV S prime:     27.30 cm/s  PULMONARY VEINS TAPSE (M-mode): 2.8 cm      A Reversal Duration: 77.00 msec                             A Reversal Velocity: 34.30 cm/s                             Diastolic Velocity:  31.80 cm/s                             S/D Velocity:        1.90                             Systolic Velocity:   59.60 cm/s LEFT ATRIUM             Index       RIGHT ATRIUM           Index LA diam:        2.80 cm 1.32 cm/m  RA Area:     16.60 cm LA Vol (A2C):   59.5 ml 28.05 ml/m RA Volume:   40.50 ml  19.09 ml/m LA Vol (A4C):   74.8 ml 35.26 ml/m LA Biplane Vol: 65.8 ml 31.02 ml/m  AORTIC VALVE                    PULMONIC VALVE AV Area (Vmax):    4.07 cm     PV Vmax:       1.20 m/s AV Area (Vmean):   4.13 cm     PV Vmean:  80.000 cm/s AV Area (VTI):     4.40 cm     PV VTI:        0.202 m AV Vmax:           171.00 cm/s  PV Peak grad:  5.8 mmHg AV Vmean:          114.000 cm/s PV Mean grad:  3.0 mmHg AV VTI:            0.290 m AV Peak Grad:      11.7 mmHg AV Mean Grad:      6.0 mmHg LVOT Vmax:         154.00 cm/s LVOT Vmean:        104.000 cm/s LVOT VTI:          0.282 m LVOT/AV VTI ratio: 0.97  AORTA Ao Root diam: 3.80 cm Ao Asc diam:  3.80 cm MITRAL VALVE               TRICUSPID VALVE MV Area (PHT): 3.89 cm    TR Peak grad:   23.4 mmHg MV Decel Time: 195 msec    TR Vmax:        242.00 cm/s MV E velocity: 59.10 cm/s MV A velocity: 93.80 cm/s  SHUNTS MV E/A ratio:  0.63        Systemic VTI:  0.28 m                            Systemic Diam: 2.40 cm Lennie Odor MD Electronically signed by Lennie Odor MD Signature Date/Time:  11/03/2020/1:46:37 PM    Final     Assessment/Plan Active Problems:   Acute on chronic respiratory failure with hypoxia (HCC)   COVID-19 virus infection   Ventilator associated pneumonia (HCC)   Acute respiratory distress syndrome (ARDS) due to COVID-19 virus (HCC)   1. Acute on chronic respiratory failure hypoxia we will continue to try to wean on pressure support patient is doing a little bit better has been weaned off of pressors 2. COVID-19 virus infection recovery we will continue to monitor closely. 3. Ventilator associated pneumonia has been treated 4. ARDS treated slow improvement   I have personally seen and evaluated the patient, evaluated laboratory and imaging results, formulated the assessment and plan and placed orders. The Patient requires high complexity decision making with multiple systems involvement.  Rounds were done with the Respiratory Therapy Director and Staff therapists and discussed with nursing staff also.  Collin Pax, MD [facility] Pulmonary Critical Care Medicine Sleep Medicine

## 2020-11-05 ENCOUNTER — Encounter (HOSPITAL_BASED_OUTPATIENT_CLINIC_OR_DEPARTMENT_OTHER): Payer: Medicare Other

## 2020-11-05 DIAGNOSIS — J9621 Acute and chronic respiratory failure with hypoxia: Secondary | ICD-10-CM | POA: Diagnosis not present

## 2020-11-05 DIAGNOSIS — I82401 Acute embolism and thrombosis of unspecified deep veins of right lower extremity: Secondary | ICD-10-CM

## 2020-11-05 DIAGNOSIS — J95851 Ventilator associated pneumonia: Secondary | ICD-10-CM | POA: Diagnosis not present

## 2020-11-05 DIAGNOSIS — U071 COVID-19: Secondary | ICD-10-CM | POA: Diagnosis not present

## 2020-11-05 DIAGNOSIS — J80 Acute respiratory distress syndrome: Secondary | ICD-10-CM | POA: Diagnosis not present

## 2020-11-05 LAB — CBC
HCT: 30.6 % — ABNORMAL LOW (ref 39.0–52.0)
Hemoglobin: 9.9 g/dL — ABNORMAL LOW (ref 13.0–17.0)
MCH: 28 pg (ref 26.0–34.0)
MCHC: 32.4 g/dL (ref 30.0–36.0)
MCV: 86.4 fL (ref 80.0–100.0)
Platelets: 161 10*3/uL (ref 150–400)
RBC: 3.54 MIL/uL — ABNORMAL LOW (ref 4.22–5.81)
RDW: 17.4 % — ABNORMAL HIGH (ref 11.5–15.5)
WBC: 10.1 10*3/uL (ref 4.0–10.5)
nRBC: 0 % (ref 0.0–0.2)

## 2020-11-05 LAB — BASIC METABOLIC PANEL
Anion gap: 7 (ref 5–15)
BUN: 13 mg/dL (ref 8–23)
CO2: 31 mmol/L (ref 22–32)
Calcium: 9.4 mg/dL (ref 8.9–10.3)
Chloride: 97 mmol/L — ABNORMAL LOW (ref 98–111)
Creatinine, Ser: 0.66 mg/dL (ref 0.61–1.24)
GFR, Estimated: >60 mL/min (ref >60)
Glucose, Bld: 113 mg/dL — ABNORMAL HIGH (ref 70–99)
Potassium: 4.1 mmol/L (ref 3.5–5.1)
Sodium: 135 mmol/L (ref 135–145)

## 2020-11-05 LAB — MAGNESIUM: Magnesium: 1.8 mg/dL (ref 1.7–2.4)

## 2020-11-05 NOTE — Progress Notes (Signed)
Pulmonary Critical Care Medicine [facility] [place]   PULMONARY CRITICAL CARE SERVICE  PROGRESS NOTE  Date of Service: 11/05/2020  Donshay Lupinski  QZE:[id number]  DOB: 1956-05-30   DOA: 10/13/2020  Referring Physician: Carron Curie, MD  HPI: Karon Heckendorn is a 65 y.o. male seen for follow up of Acute on Chronic Respiratory Failure.  Patient currently is on pressure support has been on 12/5 appears to be comfortable  Medications: Reviewed on Rounds  Physical Exam:  Vitals: Temperature is 97.8 pulse 78 respiratory 26 blood pressure is 138/96 saturations 100%  Ventilator Settings on pressure support FiO2 40% pressure 12  . General: Comfortable at this time . Eyes: Grossly normal lids, irises & conjunctiva . ENT: grossly tongue is normal . Neck: no obvious mass . Cardiovascular: S1 S2 normal no gallop . Respiratory: Scattered rhonchi expansion is equal . Abdomen: soft . Skin: no rash seen on limited exam . Musculoskeletal: not rigid . Psychiatric:unable to assess . Neurologic: no seizure no involuntary movements         Lab Data:   Basic Metabolic Panel: Recent Labs  Lab 11/02/20 0438 11/02/20 1438 11/04/20 2031 11/05/20 0536  NA 134* 136 134* 135  K 4.1 4.8 4.5 4.1  CL 93* 97* 98 97*  CO2 35* 31 29 31   GLUCOSE 126* 276* 183* 113*  BUN 19 23 18 13   CREATININE 0.60* 0.74 0.59* 0.66  CALCIUM 9.6 10.2 9.3 9.4  MG  --  1.9 1.8 1.8    ABG: Recent Labs  Lab 10/31/20 1018  PHART 7.348*  PCO2ART 64.1*  PO2ART 63.8*  HCO3 34.4*  O2SAT 90.5    Liver Function Tests: No results for input(s): AST, ALT, ALKPHOS, BILITOT, PROT, ALBUMIN in the last 168 hours. No results for input(s): LIPASE, AMYLASE in the last 168 hours. No results for input(s): AMMONIA in the last 168 hours.  CBC: Recent Labs  Lab 11/02/20 0438 11/02/20 1438 11/03/20 0718 11/04/20 2031 11/05/20 0536  WBC 12.7* 14.1* 9.0 10.2 10.1  HGB 9.9* 10.8* 9.5* 9.8* 9.9*  HCT 30.6* 35.6*  29.4* 29.9* 30.6*  MCV 86.4 89.4 87.8 85.7 86.4  PLT 151 190 142* 156 161    Cardiac Enzymes: No results for input(s): CKTOTAL, CKMB, CKMBINDEX, TROPONINI in the last 168 hours.  BNP (last 3 results) No results for input(s): BNP in the last 8760 hours.  ProBNP (last 3 results) No results for input(s): PROBNP in the last 8760 hours.  Radiological Exams: CT ABDOMEN PELVIS W CONTRAST  Result Date: 11/04/2020 CLINICAL DATA:  "Bleeding" EXAM: CT ABDOMEN AND PELVIS WITH CONTRAST TECHNIQUE: Multidetector CT imaging of the abdomen and pelvis was performed using the standard protocol following bolus administration of intravenous contrast. CONTRAST:  11/07/20 OMNIPAQUE IOHEXOL 300 MG/ML  SOLN COMPARISON:  Chest CT from 3 days ago FINDINGS: Lower chest: Known airspace disease in the lower lungs after recent COVID infection. Hepatobiliary: No focal liver abnormality.No evidence of biliary obstruction or stone. Pancreas: Unremarkable. Spleen: Unremarkable. Adrenals/Urinary Tract: Negative adrenals. Left hydronephrosis and upper hydroureter due to a upper ureteral stone which measures 16 x 9 mm. There is left renal cortical thinning suggesting longstanding obstruction. The stone has been seen over the left flank since at least February 2022. 11 mm left lower pole renal calculus. Right lower pole renal calculi with limited assessment due to motion. The largest discrete stone measures 12 mm. A Foley catheter completely decompresses the urinary bladder. Stomach/Bowel: No obstruction. No visible bowel inflammation. A percutaneous gastrostomy  tube is in unremarkable position. Vascular/Lymphatic: No acute vascular abnormality. No mass or adenopathy. Reproductive:Unremarkable Other: No ascites or pneumoperitoneum. Musculoskeletal: Calcification surrounds the hip joints, likely from recent immobility. 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 IMPRESSION: 1. No  acute finding. 2. 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 3. Bilateral nephrolithiasis. Electronically Signed   By: Marnee Spring M.D.   On: 11/04/2020 05:13   ECHOCARDIOGRAM COMPLETE  Result Date: 11/03/2020    ECHOCARDIOGRAM REPORT   Patient Name:   CURTISS Mcewen Date of Exam: 11/03/2020 Medical Rec #:  [id number]     Height:       74.0 in Accession #:    [id number]    Weight:       189.0 lb Date of Birth:  [birth year]     BSA:          2.122 m Patient Age:    64 years      BP:           139/78 mmHg Patient Gender: M             HR:           91 bpm. Exam Location:  Inpatient Procedure: 2D Echo, 3D Echo, Cardiac Doppler, Color Doppler and Strain Analysis Indications:    Myocardial infarction  History:        Patient has no prior history of Echocardiogram examinations.  Sonographer:    Neomia Dear RDCS Referring Phys: [id number] ALI HIJAZI IMPRESSIONS  1. Left ventricular ejection fraction, by estimation, is 60 to 65%. The left ventricle has normal function. The left ventricle has no regional wall motion abnormalities. There is mild asymmetric left ventricular hypertrophy of the basal-septal segment. Left ventricular diastolic parameters are consistent with Grade I diastolic dysfunction (impaired relaxation).  2. Right ventricular systolic function is normal. The right ventricular size is normal. There is normal pulmonary artery systolic pressure. The estimated right ventricular systolic pressure is 26.4 mmHg.  3. Left atrial size was mildly dilated.  4. The mitral valve is grossly normal. No evidence of mitral valve regurgitation. No evidence of mitral stenosis.  5. The aortic valve is tricuspid. Aortic valve regurgitation is not visualized. No aortic stenosis is present.  6. The inferior vena cava is normal in size with greater than 50% respiratory variability, suggesting right atrial pressure of 3 mmHg. FINDINGS  Left Ventricle: Left ventricular ejection  fraction, by estimation, is 60 to 65%. The left ventricle has normal function. The left ventricle has no regional wall motion abnormalities. 3D left ventricular ejection fraction analysis performed but not reported based on interpreter judgement due to suboptimal quality. The left ventricular internal cavity size was normal in size. There is mild asymmetric left ventricular hypertrophy of the basal-septal segment. Left ventricular diastolic parameters are consistent with Grade I diastolic dysfunction (impaired relaxation). Right Ventricle: The right ventricular size is normal. No increase in right ventricular wall thickness. Right ventricular systolic function is normal. There is normal pulmonary artery systolic pressure. The tricuspid regurgitant velocity is 2.42 m/s, and  with an assumed right atrial pressure of 3 mmHg, the estimated right ventricular systolic pressure is 26.4 mmHg. Left Atrium: Left atrial size was mildly dilated. Right Atrium: Right atrial size was normal in size. Pericardium: Trivial pericardial effusion is present. Presence of pericardial fat pad. Mitral Valve: The mitral valve is grossly normal. No evidence of mitral valve regurgitation. No evidence  of mitral valve stenosis. Tricuspid Valve: The tricuspid valve is grossly normal. Tricuspid valve regurgitation is trivial. No evidence of tricuspid stenosis. Aortic Valve: The aortic valve is tricuspid. Aortic valve regurgitation is not visualized. No aortic stenosis is present. Aortic valve mean gradient measures 6.0 mmHg. Aortic valve peak gradient measures 11.7 mmHg. Aortic valve area, by VTI measures 4.40  cm. Pulmonic Valve: The pulmonic valve was grossly normal. Pulmonic valve regurgitation is not visualized. No evidence of pulmonic stenosis. Aorta: The aortic root and ascending aorta are structurally normal, with no evidence of dilitation. Venous: The right upper pulmonary vein is normal. The inferior vena cava is normal in size with  greater than 50% respiratory variability, suggesting right atrial pressure of 3 mmHg. IAS/Shunts: The atrial septum is grossly normal.  LEFT VENTRICLE PLAX 2D LVIDd:         4.20 cm  Diastology LVIDs:         1.60 cm  LV e' medial:    6.74 cm/s LV PW:         0.90 cm  LV E/e' medial:  8.8 LV IVS:        1.30 cm  LV e' lateral:   8.81 cm/s LVOT diam:     2.40 cm  LV E/e' lateral: 6.7 LV SV:         128 LV SV Index:   60 LVOT Area:     4.52 cm                          3D Volume EF:                         3D EF:        58 % RIGHT VENTRICLE RV S prime:     27.30 cm/s  PULMONARY VEINS TAPSE (M-mode): 2.8 cm      A Reversal Duration: 77.00 msec                             A Reversal Velocity: 34.30 cm/s                             Diastolic Velocity:  31.80 cm/s                             S/D Velocity:        1.90                             Systolic Velocity:   59.60 cm/s LEFT ATRIUM             Index       RIGHT ATRIUM           Index LA diam:        2.80 cm 1.32 cm/m  RA Area:     16.60 cm LA Vol (A2C):   59.5 ml 28.05 ml/m RA Volume:   40.50 ml  19.09 ml/m LA Vol (A4C):   74.8 ml 35.26 ml/m LA Biplane Vol: 65.8 ml 31.02 ml/m  AORTIC VALVE                    PULMONIC VALVE AV Area (Vmax):    4.07 cm  PV Vmax:       1.20 m/s AV Area (Vmean):   4.13 cm     PV Vmean:      80.000 cm/s AV Area (VTI):     4.40 cm     PV VTI:        0.202 m AV Vmax:           171.00 cm/s  PV Peak grad:  5.8 mmHg AV Vmean:          114.000 cm/s PV Mean grad:  3.0 mmHg AV VTI:            0.290 m AV Peak Grad:      11.7 mmHg AV Mean Grad:      6.0 mmHg LVOT Vmax:         154.00 cm/s LVOT Vmean:        104.000 cm/s LVOT VTI:          0.282 m LVOT/AV VTI ratio: 0.97  AORTA Ao Root diam: 3.80 cm Ao Asc diam:  3.80 cm MITRAL VALVE               TRICUSPID VALVE MV Area (PHT): 3.89 cm    TR Peak grad:   23.4 mmHg MV Decel Time: 195 msec    TR Vmax:        242.00 cm/s MV E velocity: 59.10 cm/s MV A velocity: 93.80 cm/s  SHUNTS MV E/A  ratio:  0.63        Systemic VTI:  0.28 m                            Systemic Diam: 2.40 cm Lennie Odor MD Electronically signed by Lennie Odor MD Signature Date/Time: 11/03/2020/1:46:37 PM    Final     Assessment/Plan Active Problems:   Acute on chronic respiratory failure with hypoxia (HCC)   COVID-19 virus infection   Ventilator associated pneumonia (HCC)   Acute respiratory distress syndrome (ARDS) due to COVID-19 virus (HCC)   1. Acute on chronic respiratory failure hypoxia we will gradually try to advance the weaning as tolerated. 2. COVID-19 virus infection recovery phase 3. Ventilator associated pneumonia treated we will continue to monitor 4. ARDS treated improving   I have personally seen and evaluated the patient, evaluated laboratory and imaging results, formulated the assessment and plan and placed orders. The Patient requires high complexity decision making with multiple systems involvement.  Rounds were done with the Respiratory Therapy Director and Staff therapists and discussed with nursing staff also.  Yevonne Pax, MD [facility] Pulmonary Critical Care Medicine Sleep Medicine

## 2020-11-06 DIAGNOSIS — J9621 Acute and chronic respiratory failure with hypoxia: Secondary | ICD-10-CM | POA: Diagnosis not present

## 2020-11-06 DIAGNOSIS — J80 Acute respiratory distress syndrome: Secondary | ICD-10-CM | POA: Diagnosis not present

## 2020-11-06 DIAGNOSIS — U071 COVID-19: Secondary | ICD-10-CM | POA: Diagnosis not present

## 2020-11-06 DIAGNOSIS — J95851 Ventilator associated pneumonia: Secondary | ICD-10-CM | POA: Diagnosis not present

## 2020-11-06 LAB — VANCOMYCIN, TROUGH: Vancomycin Tr: 19 ug/mL (ref 15–20)

## 2020-11-06 NOTE — Progress Notes (Signed)
Pulmonary Critical Care Medicine [facility] [place]   PULMONARY CRITICAL CARE SERVICE  PROGRESS NOTE  Date of Service: 11/06/2020  Collin Robertson  GHW:[id number]  DOB: [birth year]   DOA: 10/13/2020  Referring Physician: Carron Curie, MD  HPI: Collin Robertson is a 64 y.o. male seen for follow up of Acute on Chronic Respiratory Failure.  Patient is still a low-grade fever right now comfortable without distress  Medications: Reviewed on Rounds  Physical Exam:  Vitals: Temperature is 97.3 pulse 62 respiratory 24 blood pressure is 123/72 saturations 100  Ventilator Settings on pressure support FiO2 40% pressure 12/5  . General: Comfortable at this time . Eyes: Grossly normal lids, irises & conjunctiva . ENT: grossly tongue is normal . Neck: no obvious mass . Cardiovascular: S1 S2 normal no gallop . Respiratory: Scattered rhonchi expansion is equal . Abdomen: soft . Skin: no rash seen on limited exam . Musculoskeletal: not rigid . Psychiatric:unable to assess . Neurologic: no seizure no involuntary movements         Lab Data:   Basic Metabolic Panel: Recent Labs  Lab 11/02/20 0438 11/02/20 1438 11/04/20 2031 11/05/20 0536  NA 134* 136 134* 135  K 4.1 4.8 4.5 4.1  CL 93* 97* 98 97*  CO2 35* 31 29 31   GLUCOSE 126* 276* 183* 113*  BUN 19 23 18 13   CREATININE 0.60* 0.74 0.59* 0.66  CALCIUM 9.6 10.2 9.3 9.4  MG  --  1.9 1.8 1.8    ABG: Recent Labs  Lab 10/31/20 1018  PHART 7.348*  PCO2ART 64.1*  PO2ART 63.8*  HCO3 34.4*  O2SAT 90.5    Liver Function Tests: No results for input(s): AST, ALT, ALKPHOS, BILITOT, PROT, ALBUMIN in the last 168 hours. No results for input(s): LIPASE, AMYLASE in the last 168 hours. No results for input(s): AMMONIA in the last 168 hours.  CBC: Recent Labs  Lab 11/02/20 0438 11/02/20 1438 11/03/20 0718 11/04/20 2031 11/05/20 0536  WBC 12.7* 14.1* 9.0 10.2 10.1  HGB 9.9* 10.8* 9.5* 9.8* 9.9*  HCT 30.6* 35.6* 29.4*  29.9* 30.6*  MCV 86.4 89.4 87.8 85.7 86.4  PLT 151 190 142* 156 161    Cardiac Enzymes: No results for input(s): CKTOTAL, CKMB, CKMBINDEX, TROPONINI in the last 168 hours.  BNP (last 3 results) No results for input(s): BNP in the last 8760 hours.  ProBNP (last 3 results) No results for input(s): PROBNP in the last 8760 hours.  Radiological Exams: VAS 2032 LOWER EXTREMITY VENOUS (DVT)  Result Date: 11/05/2020  Lower Venous DVT Study Indications: F/U DVT from 3/22.  Risk Factors: DVT Found 3/22. Comparison Study: Prev 3/22 Positive Peroneal and Soleol right Performing Technologist: 4/22 RVT  Examination Guidelines: A complete evaluation includes B-mode imaging, spectral Doppler, color Doppler, and power Doppler as needed of all accessible portions of each vessel. Bilateral testing is considered an integral part of a complete examination. Limited examinations for reoccurring indications may be performed as noted. The reflux portion of the exam is performed with the patient in reverse Trendelenburg.  +---------+---------------+---------+-----------+----------+--------------+ RIGHT    CompressibilityPhasicitySpontaneityPropertiesThrombus Aging +---------+---------------+---------+-----------+----------+--------------+ CFV      Full           Yes      Yes                                 +---------+---------------+---------+-----------+----------+--------------+ SFJ      Full                                                        +---------+---------------+---------+-----------+----------+--------------+  FV Prox  Full                                                        +---------+---------------+---------+-----------+----------+--------------+ FV Mid   Full                                                        +---------+---------------+---------+-----------+----------+--------------+ FV DistalFull                                                         +---------+---------------+---------+-----------+----------+--------------+ PFV      Full                                                        +---------+---------------+---------+-----------+----------+--------------+ POP      Full           Yes      Yes                                 +---------+---------------+---------+-----------+----------+--------------+ PTV      Full                                                        +---------+---------------+---------+-----------+----------+--------------+ PERO     None                                         Sub-acute      +---------+---------------+---------+-----------+----------+--------------+ Soleal   None                                         Sub-acute      +---------+---------------+---------+-----------+----------+--------------+     Summary: RIGHT: - Findings consistent with sub-acute deep vein thrombosis involving the right peroneal veins, and right soleal veins. - Findings appear essentially unchanged compared to previous examination performed 10/25/20 - There is no evidence of superficial venous thrombosis.  - No cystic structure found in the popliteal fossa.   *See table(s) above for measurements and observations.    Preliminary     Assessment/Plan Active Problems:   Acute on chronic respiratory failure with hypoxia (HCC)   COVID-19 virus infection   Ventilator associated pneumonia (HCC)   Acute respiratory distress syndrome (ARDS) due to COVID-19 virus (HCC)   1. Acute on chronic respiratory failure hypoxia we will continue with pressure support on 40% FiO2.  Patient currently is on pressure of 12/5 2.  COVID-19 virus infection recovery phase 3. Ventilator associated pneumonia treated slow improvement 4. ARDS patient is at baseline   I have personally seen and evaluated the patient, evaluated laboratory and imaging results, formulated the assessment and plan and placed orders. The Patient requires  high complexity decision making with multiple systems involvement.  Rounds were done with the Respiratory Therapy Director and Staff therapists and discussed with nursing staff also.  Yevonne Pax, MD [facility] Pulmonary Critical Care Medicine Sleep Medicine

## 2020-11-07 DIAGNOSIS — J95851 Ventilator associated pneumonia: Secondary | ICD-10-CM | POA: Diagnosis not present

## 2020-11-07 DIAGNOSIS — J9621 Acute and chronic respiratory failure with hypoxia: Secondary | ICD-10-CM | POA: Diagnosis not present

## 2020-11-07 DIAGNOSIS — U071 COVID-19: Secondary | ICD-10-CM | POA: Diagnosis not present

## 2020-11-07 DIAGNOSIS — J80 Acute respiratory distress syndrome: Secondary | ICD-10-CM | POA: Diagnosis not present

## 2020-11-07 NOTE — Progress Notes (Signed)
Pulmonary Critical Care Medicine [facility] [place]   PULMONARY CRITICAL CARE SERVICE  PROGRESS NOTE  Date of Service: 11/07/2020  Collin Robertson  FFM:[id number]  DOB: May 05, 1956   DOA: 10/13/2020  Referring Physician: Carron Curie, MD  HPI: Collin Robertson is a 65 y.o. male seen for follow up of Acute on Chronic Respiratory Failure.  Currently is on T collar on 45% FiO2 with good saturations  Medications: Reviewed on Rounds  Physical Exam:  Vitals: Temperature is 97.5 pulse 68 respiratory rate is 22 blood pressure is 122/82 saturations 98%  Ventilator Settings on T collar FiO2 45%  . General: Comfortable at this time . Eyes: Grossly normal lids, irises & conjunctiva . ENT: grossly tongue is normal . Neck: no obvious mass . Cardiovascular: S1 S2 normal no gallop . Respiratory: Scattered rhonchi expansion is equal at this time . Abdomen: soft . Skin: no rash seen on limited exam . Musculoskeletal: not rigid . Psychiatric:unable to assess . Neurologic: no seizure no involuntary movements         Lab Data:   Basic Metabolic Panel: Recent Labs  Lab 11/02/20 0438 11/02/20 1438 11/04/20 2031 11/05/20 0536  NA 134* 136 134* 135  K 4.1 4.8 4.5 4.1  CL 93* 97* 98 97*  CO2 35* 31 29 31   GLUCOSE 126* 276* 183* 113*  BUN 19 23 18 13   CREATININE 0.60* 0.74 0.59* 0.66  CALCIUM 9.6 10.2 9.3 9.4  MG  --  1.9 1.8 1.8    ABG: No results for input(s): PHART, PCO2ART, PO2ART, HCO3, O2SAT in the last 168 hours.  Liver Function Tests: No results for input(s): AST, ALT, ALKPHOS, BILITOT, PROT, ALBUMIN in the last 168 hours. No results for input(s): LIPASE, AMYLASE in the last 168 hours. No results for input(s): AMMONIA in the last 168 hours.  CBC: Recent Labs  Lab 11/02/20 0438 11/02/20 1438 11/03/20 0718 11/04/20 2031 11/05/20 0536  WBC 12.7* 14.1* 9.0 10.2 10.1  HGB 9.9* 10.8* 9.5* 9.8* 9.9*  HCT 30.6* 35.6* 29.4* 29.9* 30.6*  MCV 86.4 89.4 87.8 85.7 86.4   PLT 151 190 142* 156 161    Cardiac Enzymes: No results for input(s): CKTOTAL, CKMB, CKMBINDEX, TROPONINI in the last 168 hours.  BNP (last 3 results) No results for input(s): BNP in the last 8760 hours.  ProBNP (last 3 results) No results for input(s): PROBNP in the last 8760 hours.  Radiological Exams: No results found.  Assessment/Plan Active Problems:   Acute on chronic respiratory failure with hypoxia (HCC)   COVID-19 virus infection   Ventilator associated pneumonia (HCC)   Acute respiratory distress syndrome (ARDS) due to COVID-19 virus (HCC)   1. Acute on chronic respiratory failure with hypoxia patient is T collar on 45% FiO2 we will continue with the T-piece at this time 2. Ventilator associated pneumonia treated slowly improving 3. COVID-19 virus infection recovery phase 4. ARDS slow improvement we will continue to monitor   I have personally seen and evaluated the patient, evaluated laboratory and imaging results, formulated the assessment and plan and placed orders. The Patient requires high complexity decision making with multiple systems involvement.  Rounds were done with the Respiratory Therapy Director and Staff therapists and discussed with nursing staff also.  2032, MD [facility] Pulmonary Critical Care Medicine Sleep Medicine

## 2020-11-08 ENCOUNTER — Encounter: Payer: Self-pay | Admitting: Internal Medicine

## 2020-11-08 DIAGNOSIS — J95851 Ventilator associated pneumonia: Secondary | ICD-10-CM | POA: Diagnosis not present

## 2020-11-08 DIAGNOSIS — U071 COVID-19: Secondary | ICD-10-CM | POA: Diagnosis not present

## 2020-11-08 DIAGNOSIS — J9621 Acute and chronic respiratory failure with hypoxia: Secondary | ICD-10-CM | POA: Diagnosis not present

## 2020-11-08 DIAGNOSIS — J80 Acute respiratory distress syndrome: Secondary | ICD-10-CM | POA: Diagnosis not present

## 2020-11-08 LAB — BASIC METABOLIC PANEL
Anion gap: 5 (ref 5–15)
BUN: 19 mg/dL (ref 8–23)
CO2: 31 mmol/L (ref 22–32)
Calcium: 9.2 mg/dL (ref 8.9–10.3)
Chloride: 99 mmol/L (ref 98–111)
Creatinine, Ser: 0.63 mg/dL (ref 0.61–1.24)
GFR, Estimated: >60 mL/min (ref >60)
Glucose, Bld: 126 mg/dL — ABNORMAL HIGH (ref 70–99)
Potassium: 3.8 mmol/L (ref 3.5–5.1)
Sodium: 135 mmol/L (ref 135–145)

## 2020-11-08 LAB — CBC
HCT: 29.8 % — ABNORMAL LOW (ref 39.0–52.0)
Hemoglobin: 9.8 g/dL — ABNORMAL LOW (ref 13.0–17.0)
MCH: 28.2 pg (ref 26.0–34.0)
MCHC: 32.9 g/dL (ref 30.0–36.0)
MCV: 85.9 fL (ref 80.0–100.0)
Platelets: 207 10*3/uL (ref 150–400)
RBC: 3.47 MIL/uL — ABNORMAL LOW (ref 4.22–5.81)
RDW: 18 % — ABNORMAL HIGH (ref 11.5–15.5)
WBC: 11.2 10*3/uL — ABNORMAL HIGH (ref 4.0–10.5)
nRBC: 0 % (ref 0.0–0.2)

## 2020-11-08 LAB — MAGNESIUM: Magnesium: 1.7 mg/dL (ref 1.7–2.4)

## 2020-11-08 LAB — VANCOMYCIN, TROUGH: Vancomycin Tr: 25 ug/mL (ref 15–20)

## 2020-11-08 NOTE — Consult Note (Addendum)
Chief Complaint: Patient was seen in consultation today for left hydronephrosis   Referring Physician(s): Dr. Manson Passey  Supervising Physician: Richarda Overlie  Patient Status: [facility] - In-pt  History of Present Illness: Collin Robertson is a 65 y.o. male with past medical history of acute respiratory failure related to COVID-19 infection.  He is now trach dependent and transferred to [place] for ongoing rehabilitation.  A CT Abdomen Pelvis was performed 11/04/20 for hematuria and a left renal calculi with associated hydronephrosis was identified.  IR consulted for percutaneous nephrostomy tube placement at the request of Dr. Manson Passey.  Case reviewed and approved by Dr. Loreta Ave.   Only known allergy is to codeine.   Patient assessed at bedside.  He does have a trach in place but is able to verbalize around the trach.  Recently found to have soleal and peronal clot and has initiated lovenox with tolerance.    Past Medical History:  Diagnosis Date   Acute on chronic respiratory failure with hypoxia (HCC)    Acute respiratory distress syndrome (ARDS) due to COVID-19 virus (HCC)    COVID-19 virus infection    Ventilator associated pneumonia (HCC)     History reviewed. No pertinent surgical history.  Allergies: Patient has no allergy information on record.  Medications: Prior to Admission medications   Not on File     History reviewed. No pertinent family history.  Social History   Socioeconomic History   Marital status: Married    Spouse name: Not on file   Number of children: Not on file   Years of education: Not on file   Highest education level: Not on file  Occupational History   Not on file  Tobacco Use   Smoking status: Not on file   Smokeless tobacco: Not on file  Substance and Sexual Activity   Alcohol use: Not on file   Drug use: Not on file   Sexual activity: Not on file  Other Topics Concern   Not on file  Social History Narrative    Not on file   Social Determinants of Health   Financial Resource Strain: Not on file  Food Insecurity: Not on file  Transportation Needs: Not on file  Physical Activity: Not on file  Stress: Not on file  Social Connections: Not on file     Review of Systems: A 12 point ROS discussed and pertinent positives are indicated in the HPI above.  All other systems are negative.  Review of Systems  Constitutional: Negative for fatigue and fever.  Respiratory: Negative for cough and shortness of breath.   Cardiovascular: Negative for chest pain.  Gastrointestinal: Negative for abdominal pain, nausea and vomiting.  Genitourinary: Positive for hematuria.  Musculoskeletal: Negative for back pain.  Psychiatric/Behavioral: Negative for behavioral problems and confusion.    Vital Signs: There were no vitals taken for this visit.  Physical Exam Vitals and nursing note reviewed.  Constitutional:      General: He is not in acute distress.    Appearance: Normal appearance. He is not ill-appearing.  HENT:     Mouth/Throat:     Mouth: Mucous membranes are moist.     Pharynx: Oropharynx is clear.  Cardiovascular:     Rate and Rhythm: Normal rate and regular rhythm.  Pulmonary:     Effort: Pulmonary effort is normal.     Breath sounds: Normal breath sounds.  Abdominal:     General: Abdomen is flat.     Palpations: Abdomen is soft.  Genitourinary:    Comments: Yellow urine in foley Skin:    General: Skin is warm and dry.  Neurological:     General: No focal deficit present.     Mental Status: He is alert and oriented to person, place, and time. Mental status is at baseline.  Psychiatric:        Mood and Affect: Mood normal.        Behavior: Behavior normal.        Thought Content: Thought content normal.        Judgment: Judgment normal.      MD Evaluation Airway: WNL (Trach in place) Heart: WNL Abdomen: WNL Chest/ Lungs: WNL ASA  Classification: 3 Mallampati/Airway  Score: Three (Trach in place)   Imaging: DG Pelvis 1-2 Views  Result Date: 10/20/2020 CLINICAL DATA:  Bilateral hip pain EXAM: PELVIS - 1-2 VIEW COMPARISON:  None. FINDINGS: SI joints are non widened. Pubic symphysis and rami appear intact. No fracture or malalignment. IMPRESSION: Negative. Electronically Signed   By: Jasmine PangKim  Fujinaga M.D.   On: 10/20/2020 15:48   DG Abd 1 View  Result Date: 10/19/2020 CLINICAL DATA:  Ileus EXAM: ABDOMEN - 1 VIEW COMPARISON:  10/15/2020 FINDINGS: Nonobstructive bowel gas pattern. No organomegaly or free air. Gastrostomy tube projects over the left upper quadrant. No acute bony abnormality. IMPRESSION: Nonobstructive bowel gas pattern.  No evidence of ileus currently. Electronically Signed   By: Charlett NoseKevin  Dover M.D.   On: 10/19/2020 11:09   CT ANGIO CHEST PE W OR WO CONTRAST  Result Date: 11/01/2020 CLINICAL DATA:  Shortness of breath. EXAM: CT ANGIOGRAPHY CHEST WITH CONTRAST TECHNIQUE: Multidetector CT imaging of the chest was performed using the standard protocol during bolus administration of intravenous contrast. Multiplanar CT image reconstructions and MIPs were obtained to evaluate the vascular anatomy. CONTRAST:  75mL OMNIPAQUE IOHEXOL 350 MG/ML SOLN COMPARISON:  10/25/2020. FINDINGS: Cardiovascular: Image quality is degraded by respiratory motion and body habitus as well as streak artifact from the upper extremities, limiting the evaluation of lobar, segmental and subsegmental pulmonary arteries. No central pulmonary embolus. Pulmonic trunk and heart are enlarged. No pericardial effusion. Mediastinum/Nodes: No pathologically enlarged mediastinal, hilar or axillary lymph nodes. Esophagus is unremarkable. Lungs/Pleura: Image quality is degraded by respiratory motion. Patchy bilateral ground-glass, mild bronchiectasis and architectural distortion. Consolidation in both lower lobes, increased somewhat from 10/25/2020. Tracheostomy is in place. Airway is otherwise  unremarkable. No pleural fluid. Upper Abdomen: Visualized portions of the liver, gallbladder, spleen, pancreas, stomach and bowel are grossly unremarkable. Musculoskeletal: Degenerative changes in the spine. No worrisome lytic or sclerotic lesions. Review of the MIP images confirms the above findings. IMPRESSION: 1. Respiratory motion, body habitus and streak artifact from the arms limit diagnostic quality in the detection of pulmonary emboli. No central pulmonary embolus. 2. Patchy bilateral ground-glass with slight coarsening and slightly increased bilateral lower lobe consolidation. 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 3. Enlarged pulmonic trunk, indicative of pulmonary arterial hypertension. Electronically Signed   By: Leanna BattlesMelinda  Blietz M.D.   On: 11/01/2020 13:26   CT ANGIO CHEST PE W OR WO CONTRAST  Result Date: 10/25/2020 CLINICAL DATA:  Shortness of breath, hypoxia, COVID-19 pneumonia EXAM: CT ANGIOGRAPHY CHEST WITH CONTRAST TECHNIQUE: Multidetector CT imaging of the chest was performed using the standard protocol during bolus administration of intravenous contrast. Multiplanar CT image reconstructions and MIPs were obtained to evaluate the vascular anatomy. CONTRAST:  75mL OMNIPAQUE IOHEXOL 350 MG/ML SOLN COMPARISON:  10/25/2020 FINDINGS: Cardiovascular: This is a  technically adequate evaluation of the pulmonary vasculature. No filling defects or pulmonary emboli. The heart is unremarkable without pericardial effusion. No evidence of thoracic aortic aneurysm or dissection. Left-sided PICC, tip within the superior vena cava. Mediastinum/Nodes: No enlarged mediastinal, hilar, or axillary lymph nodes. Thyroid gland, trachea, and esophagus demonstrate no significant findings. Tracheostomy tube tip well above carina. Lungs/Pleura: Extensive interstitial and airspace opacities are seen bilaterally, greatest at the lung bases, compatible with given history of COVID-19  pneumonia. No effusion or pneumothorax. Central airways are patent. Upper Abdomen: Percutaneous gastrostomy tube within the gastric lumen. No acute upper abdominal findings. Musculoskeletal: No acute or destructive bony lesions. Reconstructed images demonstrate no additional findings. Review of the MIP images confirms the above findings. IMPRESSION: 1. No evidence of pulmonary embolus. 2. Widespread interstitial and alveolar opacities consistent with COVID 19 pneumonia. 3. Support devices as above. Electronically Signed   By: Sharlet Salina M.D.   On: 10/25/2020 21:00   CT ABDOMEN PELVIS W CONTRAST  Result Date: 11/04/2020 CLINICAL DATA:  "Bleeding" EXAM: CT ABDOMEN AND PELVIS WITH CONTRAST TECHNIQUE: Multidetector CT imaging of the abdomen and pelvis was performed using the standard protocol following bolus administration of intravenous contrast. CONTRAST:  OMNIPAQUE IOHEXOL 300 MG/ML  SOLN COMPARISON:  Chest CT from 3 days ago FINDINGS: Lower chest: Known airspace disease in the lower lungs after recent COVID infection. Hepatobiliary: No focal liver abnormality.No evidence of biliary obstruction or stone. Pancreas: Unremarkable. Spleen: Unremarkable. Adrenals/Urinary Tract: Negative adrenals. Left hydronephrosis and upper hydroureter due to a upper ureteral stone which measures 16 x 9 mm. There is left renal cortical thinning suggesting longstanding obstruction. The stone has been seen over the left flank since at least February 2022. 11 mm left lower pole renal calculus. Right lower pole renal calculi with limited assessment due to motion. The largest discrete stone measures 12 mm. A Foley catheter completely decompresses the urinary bladder. Stomach/Bowel: No obstruction. No visible bowel inflammation. A percutaneous gastrostomy tube is in unremarkable position. Vascular/Lymphatic: No acute vascular abnormality. No mass or adenopathy. Reproductive:Unremarkable Other: No ascites or pneumoperitoneum.  Musculoskeletal: Calcification surrounds the hip joints, likely from recent immobility. 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 IMPRESSION: 1. No acute finding. 2. 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 3. Bilateral nephrolithiasis. Electronically Signed   By: Marnee Spring M.D.   On: 11/04/2020 05:13   DG ABDOMEN PEG TUBE LOCATION  Result Date: 10/13/2020 CLINICAL DATA:  Peg tube placement EXAM: ABDOMEN - 1 VIEW COMPARISON:  None. FINDINGS: Gastrostomy tube projects over the proximal stomach. Contrast pooling within the fundus. No gross extravasation is seen. IMPRESSION: Gastrostomy tube projects over the proximal stomach. No gross extravasation. Electronically Signed   By: Jasmine Pang M.D.   On: 10/13/2020 23:31   DG Chest Port 1 View  Result Date: 10/25/2020 CLINICAL DATA:  Hypoxia.  COVID-19 pneumonia EXAM: PORTABLE CHEST 1 VIEW COMPARISON:  10/14/2020 FINDINGS: Low volume chest with diffuse hazy and interstitial opacity. Cardiomegaly accentuated by technique. Tracheostomy tube and left PICC in good position. No visible effusion or pneumothorax. IMPRESSION: Stable hardware positioning, low lung volumes, and generalized airspace disease. Electronically Signed   By: Marnee Spring M.D.   On: 10/25/2020 06:12   DG CHEST PORT 1 VIEW  Result Date: 10/14/2020 CLINICAL DATA:  Respiratory failure EXAM: PORTABLE CHEST 1 VIEW COMPARISON:  None. FINDINGS: Tracheostomy tube with tip centered over the upper  thoracic trachea. Left upper extremity PICC with tip overlying the superior cavoatrial junction. The cardiac silhouette is partially obscured but appears enlarged, likely accentuated by technique. Low lung volumes with diffuse interstitial opacities and bibasilar airspace opacities. The visualized skeletal structures are unremarkable. IMPRESSION: 1. Low lung volumes with diffuse  interstitial opacities and bibasilar airspace opacities, most consistent with pulmonary edema. Multifocal infection is not excluded. Electronically Signed   By: Maudry Mayhew MD   On: 10/14/2020 14:41   DG Abd Portable 1V  Result Date: 10/15/2020 CLINICAL DATA:  Ileus EXAM: PORTABLE ABDOMEN - 1 VIEW COMPARISON:  10/13/2020 FINDINGS: Gastrostomy remains in place. Gas is present throughout the colon suggesting ileus. Contrast fills the rectal region. Small bowel pattern is unremarkable. Patchy bilateral pulmonary infiltrates are appreciable. IMPRESSION: Gas present throughout the colon suggesting ileus. Contrast fills the rectal region. Small bowel pattern is unremarkable. Gastrostomy in place. Electronically Signed   By: Paulina Fusi M.D.   On: 10/15/2020 21:01   ECHOCARDIOGRAM COMPLETE  Result Date: 11/03/2020    ECHOCARDIOGRAM REPORT   Patient Name:   MANUEL Encalada Date of Exam: 11/03/2020 Medical Rec #:  [id number]     Height:       74.0 in Accession #:    [id number]    Weight:       189.0 lb Date of Birth:  10/29/1955     BSA:          2.122 m Patient Age:    64 years      BP:           139/78 mmHg Patient Gender: M             HR:           91 bpm. Exam Location:  Inpatient Procedure: 2D Echo, 3D Echo, Cardiac Doppler, Color Doppler and Strain Analysis Indications:    Myocardial infarction  History:        Patient has no prior history of Echocardiogram examinations.  Sonographer:    Neomia Dear RDCS Referring Phys: [id number] ALI HIJAZI IMPRESSIONS  1. Left ventricular ejection fraction, by estimation, is 60 to 65%. The left ventricle has normal function. The left ventricle has no regional wall motion abnormalities. There is mild asymmetric left ventricular hypertrophy of the basal-septal segment. Left ventricular diastolic parameters are consistent with Grade I diastolic dysfunction (impaired relaxation).  2. Right ventricular systolic function is normal. The right ventricular size is normal. There is normal  pulmonary artery systolic pressure. The estimated right ventricular systolic pressure is 26.4 mmHg.  3. Left atrial size was mildly dilated.  4. The mitral valve is grossly normal. No evidence of mitral valve regurgitation. No evidence of mitral stenosis.  5. The aortic valve is tricuspid. Aortic valve regurgitation is not visualized. No aortic stenosis is present.  6. The inferior vena cava is normal in size with greater than 50% respiratory variability, suggesting right atrial pressure of 3 mmHg. FINDINGS  Left Ventricle: Left ventricular ejection fraction, by estimation, is 60 to 65%. The left ventricle has normal function. The left ventricle has no regional wall motion abnormalities. 3D left ventricular ejection fraction analysis performed but not reported based on interpreter judgement due to suboptimal quality. The left ventricular internal cavity size was normal in size. There is mild asymmetric left ventricular hypertrophy of the basal-septal segment. Left ventricular diastolic parameters are consistent with Grade I diastolic dysfunction (impaired relaxation). Right Ventricle: The right ventricular size is normal. No increase in right ventricular  wall thickness. Right ventricular systolic function is normal. There is normal pulmonary artery systolic pressure. The tricuspid regurgitant velocity is 2.42 m/s, and  with an assumed right atrial pressure of 3 mmHg, the estimated right ventricular systolic pressure is 26.4 mmHg. Left Atrium: Left atrial size was mildly dilated. Right Atrium: Right atrial size was normal in size. Pericardium: Trivial pericardial effusion is present. Presence of pericardial fat pad. Mitral Valve: The mitral valve is grossly normal. No evidence of mitral valve regurgitation. No evidence of mitral valve stenosis. Tricuspid Valve: The tricuspid valve is grossly normal. Tricuspid valve regurgitation is trivial. No evidence of tricuspid stenosis. Aortic Valve: The aortic valve is  tricuspid. Aortic valve regurgitation is not visualized. No aortic stenosis is present. Aortic valve mean gradient measures 6.0 mmHg. Aortic valve peak gradient measures 11.7 mmHg. Aortic valve area, by VTI measures 4.40  cm. Pulmonic Valve: The pulmonic valve was grossly normal. Pulmonic valve regurgitation is not visualized. No evidence of pulmonic stenosis. Aorta: The aortic root and ascending aorta are structurally normal, with no evidence of dilitation. Venous: The right upper pulmonary vein is normal. The inferior vena cava is normal in size with greater than 50% respiratory variability, suggesting right atrial pressure of 3 mmHg. IAS/Shunts: The atrial septum is grossly normal.  LEFT VENTRICLE PLAX 2D LVIDd:         4.20 cm  Diastology LVIDs:         1.60 cm  LV e' medial:    6.74 cm/s LV PW:         0.90 cm  LV E/e' medial:  8.8 LV IVS:        1.30 cm  LV e' lateral:   8.81 cm/s LVOT diam:     2.40 cm  LV E/e' lateral: 6.7 LV SV:         128 LV SV Index:   60 LVOT Area:     4.52 cm                          3D Volume EF:                         3D EF:        58 % RIGHT VENTRICLE RV S prime:     27.30 cm/s  PULMONARY VEINS TAPSE (M-mode): 2.8 cm      A Reversal Duration: 77.00 msec                             A Reversal Velocity: 34.30 cm/s                             Diastolic Velocity:  31.80 cm/s                             S/D Velocity:        1.90                             Systolic Velocity:   59.60 cm/s LEFT ATRIUM             Index       RIGHT ATRIUM           Index LA diam:  2.80 cm 1.32 cm/m  RA Area:     16.60 cm LA Vol (A2C):   59.5 ml 28.05 ml/m RA Volume:   40.50 ml  19.09 ml/m LA Vol (A4C):   74.8 ml 35.26 ml/m LA Biplane Vol: 65.8 ml 31.02 ml/m  AORTIC VALVE                    PULMONIC VALVE AV Area (Vmax):    4.07 cm     PV Vmax:       1.20 m/s AV Area (Vmean):   4.13 cm     PV Vmean:      80.000 cm/s AV Area (VTI):     4.40 cm     PV VTI:        0.202 m AV Vmax:            171.00 cm/s  PV Peak grad:  5.8 mmHg AV Vmean:          114.000 cm/s PV Mean grad:  3.0 mmHg AV VTI:            0.290 m AV Peak Grad:      11.7 mmHg AV Mean Grad:      6.0 mmHg LVOT Vmax:         154.00 cm/s LVOT Vmean:        104.000 cm/s LVOT VTI:          0.282 m LVOT/AV VTI ratio: 0.97  AORTA Ao Root diam: 3.80 cm Ao Asc diam:  3.80 cm MITRAL VALVE               TRICUSPID VALVE MV Area (PHT): 3.89 cm    TR Peak grad:   23.4 mmHg MV Decel Time: 195 msec    TR Vmax:        242.00 cm/s MV E velocity: 59.10 cm/s MV A velocity: 93.80 cm/s  SHUNTS MV E/A ratio:  0.63        Systemic VTI:  0.28 m                            Systemic Diam: 2.40 cm Lennie Odor MD Electronically signed by Lennie Odor MD Signature Date/Time: 11/03/2020/1:46:37 PM    Final    DG FEMUR 1V RIGHT  Result Date: 10/25/2020 CLINICAL DATA:  Pain right femur. EXAM: RIGHT FEMUR 1 VIEW COMPARISON:  Pelvis 10/20/2020 FINDINGS: Mild degenerative change right hip. Mild degenerative change in the knee. Large inferior osteophyte of the patella. No joint effusion Negative for fracture or bone lesion. IMPRESSION: Mild degenerative change in the hip and knee joint. No acute abnormality. Electronically Signed   By: Marlan Palau M.D.   On: 10/25/2020 10:48   VAS [place] LOWER EXTREMITY VENOUS (DVT)  Result Date: 11/07/2020  Lower Venous DVT Study Indications: F/U DVT from 3/22.  Risk Factors: DVT Found 3/22. Comparison Study: Prev 3/22 Positive Peroneal and Soleol right Performing Technologist: Clint Guy RVT  Examination Guidelines: A complete evaluation includes B-mode imaging, spectral Doppler, color Doppler, and power Doppler as needed of all accessible portions of each vessel. Bilateral testing is considered an integral part of a complete examination. Limited examinations for reoccurring indications may be performed as noted. The reflux portion of the exam is performed with the patient in reverse Trendelenburg.   +---------+---------------+---------+-----------+----------+--------------+  RIGHT     Compressibility Phasicity Spontaneity Properties Thrombus Aging  +---------+---------------+---------+-----------+----------+--------------+  CFV  Full            Yes       Yes                                    +---------+---------------+---------+-----------+----------+--------------+  SFJ       Full                                                             +---------+---------------+---------+-----------+----------+--------------+  FV Prox   Full                                                             +---------+---------------+---------+-----------+----------+--------------+  FV Mid    Full                                                             +---------+---------------+---------+-----------+----------+--------------+  FV Distal Full                                                             +---------+---------------+---------+-----------+----------+--------------+  PFV       Full                                                             +---------+---------------+---------+-----------+----------+--------------+  POP       Full            Yes       Yes                                    +---------+---------------+---------+-----------+----------+--------------+  PTV       Full                                                             +---------+---------------+---------+-----------+----------+--------------+  PERO      None                                             Sub-acute       +---------+---------------+---------+-----------+----------+--------------+  Soleal    None  Sub-acute       +---------+---------------+---------+-----------+----------+--------------+     Summary: RIGHT: - Findings consistent with sub-acute deep vein thrombosis involving the right peroneal veins, and right soleal veins. - Findings appear essentially unchanged compared to previous  examination performed 10/25/20 - There is no evidence of superficial venous thrombosis.  - No cystic structure found in the popliteal fossa.   *See table(s) above for measurements and observations. Electronically signed by Fabienne Bruns MD on 11/07/2020 at 11:17:57 AM.    Final    VAS [place] LOWER EXTREMITY VENOUS (DVT)  Result Date: 10/25/2020  Lower Venous DVT Study Indications: Swelling. Other Indications: Covid. Comparison Study: No previous Performing Technologist: Clint Guy RVT  Examination Guidelines: A complete evaluation includes B-mode imaging, spectral Doppler, color Doppler, and power Doppler as needed of all accessible portions of each vessel. Bilateral testing is considered an integral part of a complete examination. Limited examinations for reoccurring indications may be performed as noted. The reflux portion of the exam is performed with the patient in reverse Trendelenburg.  +---------+---------------+---------+-----------+----------+--------------+  RIGHT     Compressibility Phasicity Spontaneity Properties Thrombus Aging  +---------+---------------+---------+-----------+----------+--------------+  CFV       Full                                                             +---------+---------------+---------+-----------+----------+--------------+  SFJ       Full                                                             +---------+---------------+---------+-----------+----------+--------------+  FV Prox   Full                                                             +---------+---------------+---------+-----------+----------+--------------+  FV Mid    Full                                                             +---------+---------------+---------+-----------+----------+--------------+  FV Distal Full                                                             +---------+---------------+---------+-----------+----------+--------------+  PFV       Full                                                              +---------+---------------+---------+-----------+----------+--------------+  POP       Full                                                             +---------+---------------+---------+-----------+----------+--------------+  PTV       Full                                                             +---------+---------------+---------+-----------+----------+--------------+  PERO      None                                             Acute           +---------+---------------+---------+-----------+----------+--------------+  Soleal    None                                             Acute           +---------+---------------+---------+-----------+----------+--------------+  Summary: RIGHT: - Findings consistent with acute deep vein thrombosis involving the right peroneal veins, and right soleal veins. - No cystic structure found in the popliteal fossa.   *See table(s) above for measurements and observations. Electronically signed by Fabienne Bruns MD on 10/25/2020 at 9:12:02 PM.    Final     Labs:  CBC: Recent Labs    11/03/20 0718 11/04/20 2031 11/05/20 0536 11/08/20 0707  WBC 9.0 10.2 10.1 11.2*  HGB 9.5* 9.8* 9.9* 9.8*  HCT 29.4* 29.9* 30.6* 29.8*  PLT 142* 156 161 207    COAGS: No results for input(s): INR, APTT in the last 8760 hours.  BMP: Recent Labs    10/16/20 1411 10/16/20 1818 11/02/20 1438 11/04/20 2031 11/05/20 0536 11/08/20 0707  NA CANCELLED BY LAB   < > 136 134* 135 135  K CANCELLED BY LAB   5.5*   < > 4.8 4.5 4.1 3.8  CL CANCELLED BY LAB   < > 97* 98 97* 99  CO2 CANCELLED BY LAB   < > 31 29 31 31   GLUCOSE CANCELLED BY LAB   < > 276* 183* 113* 126*  BUN CANCELLED BY LAB   < > 23 18 13 19   CALCIUM CANCELLED BY LAB   < > 10.2 9.3 9.4 9.2  CREATININE CANCELLED BY LAB   < > 0.74 0.59* 0.66 0.63  GFRNONAA CANCELLED BY LAB   < > >60 >60 >60 >60  GFRAA CANCELLED BY LAB  --   --   --   --   --    < > = values in this interval not displayed.     LIVER FUNCTION TESTS: No results for input(s): BILITOT, AST, ALT, ALKPHOS, PROT, ALBUMIN in the last 8760 hours.  TUMOR MARKERS: No results for input(s): AFPTM, CEA, CA199, CHROMGRNA in the last 8760 hours.  Assessment and Plan: Left hydronephrosis Patient with left hydronephrosis found during work-up for hematuria.  Found to have a left renal calculi.  [facility] consulted and recommend nephrostomy tube placement with eventual stone retrieval once discharged. [facility] to manage tube while inpatient.  Discussed procedure with patient at bedside.  He does have a trach but is conversant and able to ask questions.  He would like to discuss further with his wife this evening prior to providing consent.  All questions answered.  Lovenox held.  NPO p MN for possible procedure.   Risks and benefits of left PCN placement was discussed with the patient including, but not limited to, infection, bleeding, significant bleeding causing loss or decrease in renal function or damage to adjacent structures.   All of the patient's questions were answered, patient is agreeable to proceed.  Consent signed and in chart.  Thank you for this interesting consult.  I greatly enjoyed meeting Willa Stockstill and look forward to participating in their care.  A copy of this report was sent to the requesting provider on this date.  Electronically Signed: Hoyt Koch, PA 11/08/2020, 4:30 PM   I spent a total of 40 Minutes    in face to face in clinical consultation, greater than 50% of which was counseling/coordinating care for left renal calculi.

## 2020-11-09 DIAGNOSIS — J9621 Acute and chronic respiratory failure with hypoxia: Secondary | ICD-10-CM | POA: Diagnosis not present

## 2020-11-09 DIAGNOSIS — J80 Acute respiratory distress syndrome: Secondary | ICD-10-CM | POA: Diagnosis not present

## 2020-11-09 DIAGNOSIS — U071 COVID-19: Secondary | ICD-10-CM | POA: Diagnosis not present

## 2020-11-09 DIAGNOSIS — J95851 Ventilator associated pneumonia: Secondary | ICD-10-CM | POA: Diagnosis not present

## 2020-11-09 LAB — CULTURE, BLOOD (ROUTINE X 2)
Culture: NO GROWTH
Culture: NO GROWTH

## 2020-11-09 NOTE — Progress Notes (Signed)
Pulmonary Critical Care Medicine [facility] [place]   PULMONARY CRITICAL CARE SERVICE  PROGRESS NOTE    Collin Robertson  [id number]  DOB: 09-14-1955   DOA: 10/13/2020  Referring Physician: Carron Curie, MD  HPI: Collin Robertson is a 65 y.o. male seen for follow up of Acute on Chronic Respiratory Failure.  Patient is comfortable right now without distress at this time has been on pressure support on 45% FiO2  Medications: Reviewed on Rounds  Physical Exam:  Vitals: Temperature is 97.6 pulse 95 respiratory rate is 30 blood pressure is 129/76 saturations 97%  Ventilator Settings on pressure support FiO2 45% pressure 12/5  . General: Comfortable at this time . Eyes: Grossly normal lids, irises & conjunctiva . ENT: grossly tongue is normal . Neck: no obvious mass . Cardiovascular: S1 S2 normal no gallop . Respiratory: Scattered rhonchi coarse breath sounds . Abdomen: soft . Skin: no rash seen on limited exam . Musculoskeletal: not rigid . Psychiatric:unable to assess . Neurologic: no seizure no involuntary movements         Lab Data:   Basic Metabolic Panel: Recent Labs  Lab 11/02/20 1438 11/04/20 2031 11/05/20 0536 11/08/20 0707  NA 136 134* 135 135  K 4.8 4.5 4.1 3.8  CL 97* 98 97* 99  CO2 31 29 31 31   GLUCOSE 276* 183* 113* 126*  BUN 23 18 13 19   CREATININE 0.74 0.59* 0.66 0.63  CALCIUM 10.2 9.3 9.4 9.2  MG 1.9 1.8 1.8 1.7    ABG: No results for input(s): PHART, PCO2ART, PO2ART, HCO3, O2SAT in the last 168 hours.  Liver Function Tests: No results for input(s): AST, ALT, ALKPHOS, BILITOT, PROT, ALBUMIN in the last 168 hours. No results for input(s): LIPASE, AMYLASE in the last 168 hours. No results for input(s): AMMONIA in the last 168 hours.  CBC: Recent Labs  Lab 11/02/20 1438 11/03/20 0718 11/04/20 2031 11/05/20 0536 11/08/20 0707  WBC 14.1* 9.0 10.2 10.1 11.2*  HGB 10.8* 9.5* 9.8* 9.9* 9.8*  HCT 35.6* 29.4* 29.9* 30.6* 29.8*  MCV  89.4 87.8 85.7 86.4 85.9  PLT 190 142* 156 161 207    Cardiac Enzymes: No results for input(s): CKTOTAL, CKMB, CKMBINDEX, TROPONINI in the last 168 hours.  BNP (last 3 results) No results for input(s): BNP in the last 8760 hours.  ProBNP (last 3 results) No results for input(s): PROBNP in the last 8760 hours.  Radiological Exams: No results found.  Assessment/Plan Active Problems:   Acute on chronic respiratory failure with hypoxia (HCC)   COVID-19 virus infection   Ventilator associated pneumonia (HCC)   Acute respiratory distress syndrome (ARDS) due to COVID-19 virus (HCC)   1. Acute on chronic respiratory failure hypoxia on pressure support currently on a pressure of 12/5. 2. COVID-19 virus infection recovery phase we will continue to follow 3. Ventilator associated pneumonia no change 4. ARDS treated slow improvement   I have personally seen and evaluated the patient, evaluated laboratory and imaging results, formulated the assessment and plan and placed orders. The Patient requires high complexity decision making with multiple systems involvement.  Rounds were done with the Respiratory Therapy Director and Staff therapists and discussed with nursing staff also.  11/10/20, MD [facility] Pulmonary Critical Care Medicine Sleep Medicine

## 2020-11-09 NOTE — Progress Notes (Signed)
Patient ID: Collin Robertson, male   DOB: 03-11-1956, 65 y.o.   MRN: [id number]    Was tentatively scheduled for L PCN in IR this week See consult note 11/08/20 He wants also to speak further to his wife.  I spoke to pt this am He says he wants to see his PMD when discharged from [facility] --- for referral and evaluation with  Urology and then follow up at "home" He is not from [place] area. Prefers to hold on this PCN placement.  Creatinine stable at 0.63 Does have some minimal hematuria Has no other symptoms; pain or obstruction.  We will cancel PCN for now Call us if needs [place]--- or if symptoms develop; change in Cr level.

## 2020-11-09 NOTE — Progress Notes (Signed)
Pulmonary Critical Care Medicine [facility] [place]   PULMONARY CRITICAL CARE SERVICE  PROGRESS NOTE  Date of Service: 11/09/2020   Collin Robertson  [id number]  DOB: [birth year]   DOA: 10/13/2020  Referring Physician: Carron Curie, MD  HPI: Collin Robertson is a 65 y.o. male seen for follow up of Acute on Chronic Respiratory Failure.  Patient is on T collar currently on 50% FiO2 good saturations are noted  Medications: Reviewed on Rounds  Physical Exam:  Vitals: Temperature is 97.9 pulse 84 respiratory 20 blood pressure is 124/79 saturations 100%  Ventilator Settings on T collar with an FiO2 of 50%  . General: Comfortable at this time . Eyes: Grossly normal lids, irises & conjunctiva . ENT: grossly tongue is normal . Neck: no obvious mass . Cardiovascular: S1 S2 normal no gallop . Respiratory: Scattered rhonchi expansion is equal . Abdomen: soft . Skin: no rash seen on limited exam . Musculoskeletal: not rigid . Psychiatric:unable to assess . Neurologic: no seizure no involuntary movements         Lab Data:   Basic Metabolic Panel: Recent Labs  Lab 11/02/20 1438 11/04/20 2031 11/05/20 0536 11/08/20 0707  NA 136 134* 135 135  K 4.8 4.5 4.1 3.8  CL 97* 98 97* 99  CO2 31 29 31 31   GLUCOSE 276* 183* 113* 126*  BUN 23 18 13 19   CREATININE 0.74 0.59* 0.66 0.63  CALCIUM 10.2 9.3 9.4 9.2  MG 1.9 1.8 1.8 1.7    ABG: No results for input(s): PHART, PCO2ART, PO2ART, HCO3, O2SAT in the last 168 hours.  Liver Function Tests: No results for input(s): AST, ALT, ALKPHOS, BILITOT, PROT, ALBUMIN in the last 168 hours. No results for input(s): LIPASE, AMYLASE in the last 168 hours. No results for input(s): AMMONIA in the last 168 hours.  CBC: Recent Labs  Lab 11/02/20 1438 11/03/20 0718 11/04/20 2031 11/05/20 0536 11/08/20 0707  WBC 14.1* 9.0 10.2 10.1 11.2*  HGB 10.8* 9.5* 9.8* 9.9* 9.8*  HCT 35.6* 29.4* 29.9* 30.6* 29.8*  MCV 89.4 87.8 85.7 86.4 85.9   PLT 190 142* 156 161 207    Cardiac Enzymes: No results for input(s): CKTOTAL, CKMB, CKMBINDEX, TROPONINI in the last 168 hours.  BNP (last 3 results) No results for input(s): BNP in the last 8760 hours.  ProBNP (last 3 results) No results for input(s): PROBNP in the last 8760 hours.  Radiological Exams: No results found.  Assessment/Plan Active Problems:   Acute on chronic respiratory failure with hypoxia (HCC)   COVID-19 virus infection   Ventilator associated pneumonia (HCC)   Acute respiratory distress syndrome (ARDS) due to COVID-19 virus (HCC)   1. Acute on chronic respiratory failure hypoxia we will continue with T collar trials on 50% FiO2. 2. COVID-19 virus infection recovery phase 3. Ventilator associated pneumonia treated slowly improving 4. ARDS patient is at baseline   I have personally seen and evaluated the patient, evaluated laboratory and imaging results, formulated the assessment and plan and placed orders. The Patient requires high complexity decision making with multiple systems involvement.  Rounds were done with the Respiratory Therapy Director and Staff therapists and discussed with nursing staff also.  11/07/20, MD [facility] Pulmonary Critical Care Medicine Sleep Medicine

## 2020-11-10 DIAGNOSIS — U071 COVID-19: Secondary | ICD-10-CM | POA: Diagnosis not present

## 2020-11-10 DIAGNOSIS — J95851 Ventilator associated pneumonia: Secondary | ICD-10-CM | POA: Diagnosis not present

## 2020-11-10 DIAGNOSIS — J80 Acute respiratory distress syndrome: Secondary | ICD-10-CM | POA: Diagnosis not present

## 2020-11-10 DIAGNOSIS — J9621 Acute and chronic respiratory failure with hypoxia: Secondary | ICD-10-CM | POA: Diagnosis not present

## 2020-11-10 LAB — BASIC METABOLIC PANEL
Anion gap: 6 (ref 5–15)
BUN: 23 mg/dL (ref 8–23)
CO2: 33 mmol/L — ABNORMAL HIGH (ref 22–32)
Calcium: 9.4 mg/dL (ref 8.9–10.3)
Chloride: 97 mmol/L — ABNORMAL LOW (ref 98–111)
Creatinine, Ser: 0.64 mg/dL (ref 0.61–1.24)
GFR, Estimated: >60 mL/min (ref >60)
Glucose, Bld: 158 mg/dL — ABNORMAL HIGH (ref 70–99)
Potassium: 4.2 mmol/L (ref 3.5–5.1)
Sodium: 136 mmol/L (ref 135–145)

## 2020-11-10 LAB — CBC
HCT: 30.8 % — ABNORMAL LOW (ref 39.0–52.0)
Hemoglobin: 9.9 g/dL — ABNORMAL LOW (ref 13.0–17.0)
MCH: 27.8 pg (ref 26.0–34.0)
MCHC: 32.1 g/dL (ref 30.0–36.0)
MCV: 86.5 fL (ref 80.0–100.0)
Platelets: 242 10*3/uL (ref 150–400)
RBC: 3.56 MIL/uL — ABNORMAL LOW (ref 4.22–5.81)
RDW: 17.6 % — ABNORMAL HIGH (ref 11.5–15.5)
WBC: 10.4 10*3/uL (ref 4.0–10.5)
nRBC: 0 % (ref 0.0–0.2)

## 2020-11-10 LAB — MAGNESIUM: Magnesium: 1.9 mg/dL (ref 1.7–2.4)

## 2020-11-10 NOTE — Progress Notes (Signed)
Pulmonary Critical Care Medicine [facility] [place]   PULMONARY CRITICAL CARE SERVICE  PROGRESS NOTE  Date of Service: 11/10/2020   Collin Robertson  [id number]  DOB: 1955-08-30   DOA: 10/13/2020  Referring Physician: Carron Curie, MD  HPI: Collin Robertson is a 65 y.o. male seen for follow up of Acute on Chronic Respiratory Failure.  Patient right now is afebrile comfortable without distress at this time has been off of mechanical ventilation  Medications: Reviewed on Rounds  Physical Exam:  Vitals: Temperature is 97.0 pulse 52 respiratory 22 blood pressure is 148/78 saturations 99%  Ventilator Settings on T collar FiO2 40%  . General: Comfortable at this time . Eyes: Grossly normal lids, irises & conjunctiva . ENT: grossly tongue is normal . Neck: no obvious mass . Cardiovascular: S1 S2 normal no gallop . Respiratory: Scattered rhonchi expansion is equal . Abdomen: soft . Skin: no rash seen on limited exam . Musculoskeletal: not rigid . Psychiatric:unable to assess . Neurologic: no seizure no involuntary movements         Lab Data:   Basic Metabolic Panel: Recent Labs  Lab 11/04/20 2031 11/05/20 0536 11/08/20 0707 11/10/20 0420  NA 134* 135 135 136  K 4.5 4.1 3.8 4.2  CL 98 97* 99 97*  CO2 29 31 31  33*  GLUCOSE 183* 113* 126* 158*  BUN 18 13 19 23   CREATININE 0.59* 0.66 0.63 0.64  CALCIUM 9.3 9.4 9.2 9.4  MG 1.8 1.8 1.7 1.9    ABG: No results for input(s): PHART, PCO2ART, PO2ART, HCO3, O2SAT in the last 168 hours.  Liver Function Tests: No results for input(s): AST, ALT, ALKPHOS, BILITOT, PROT, ALBUMIN in the last 168 hours. No results for input(s): LIPASE, AMYLASE in the last 168 hours. No results for input(s): AMMONIA in the last 168 hours.  CBC: Recent Labs  Lab 11/04/20 2031 11/05/20 0536 11/08/20 0707 11/10/20 0420  WBC 10.2 10.1 11.2* 10.4  HGB 9.8* 9.9* 9.8* 9.9*  HCT 29.9* 30.6* 29.8* 30.8*  MCV 85.7 86.4 85.9 86.5  PLT 156  161 207 242    Cardiac Enzymes: No results for input(s): CKTOTAL, CKMB, CKMBINDEX, TROPONINI in the last 168 hours.  BNP (last 3 results) No results for input(s): BNP in the last 8760 hours.  ProBNP (last 3 results) No results for input(s): PROBNP in the last 8760 hours.  Radiological Exams: No results found.  Assessment/Plan Active Problems:   Acute on chronic respiratory failure with hypoxia (HCC)   COVID-19 virus infection   Ventilator associated pneumonia (HCC)   Acute respiratory distress syndrome (ARDS) due to COVID-19 virus (HCC)   1. Acute on chronic respiratory failure hypoxia continue to wean on T collar 8-hour goal 2. COVID-19 virus infection resolution 3. Ventilator associated pneumonia treated improving 4. ARDS slow improvement   I have personally seen and evaluated the patient, evaluated laboratory and imaging results, formulated the assessment and plan and placed orders. The Patient requires high complexity decision making with multiple systems involvement.  Rounds were done with the Respiratory Therapy Director and Staff therapists and discussed with nursing staff also.  11/10/20, MD [facility] Pulmonary Critical Care Medicine Sleep Medicine

## 2020-11-11 DIAGNOSIS — J9621 Acute and chronic respiratory failure with hypoxia: Secondary | ICD-10-CM | POA: Diagnosis not present

## 2020-11-11 DIAGNOSIS — J95851 Ventilator associated pneumonia: Secondary | ICD-10-CM | POA: Diagnosis not present

## 2020-11-11 DIAGNOSIS — J80 Acute respiratory distress syndrome: Secondary | ICD-10-CM | POA: Diagnosis not present

## 2020-11-11 DIAGNOSIS — U071 COVID-19: Secondary | ICD-10-CM | POA: Diagnosis not present

## 2020-11-12 DIAGNOSIS — J95851 Ventilator associated pneumonia: Secondary | ICD-10-CM | POA: Diagnosis not present

## 2020-11-12 DIAGNOSIS — J9621 Acute and chronic respiratory failure with hypoxia: Secondary | ICD-10-CM | POA: Diagnosis not present

## 2020-11-12 DIAGNOSIS — J80 Acute respiratory distress syndrome: Secondary | ICD-10-CM | POA: Diagnosis not present

## 2020-11-12 DIAGNOSIS — U071 COVID-19: Secondary | ICD-10-CM | POA: Diagnosis not present

## 2020-11-12 LAB — BLOOD GAS, ARTERIAL
Acid-Base Excess: 7.3 mmol/L — ABNORMAL HIGH (ref 0.0–2.0)
Bicarbonate: 31.5 mmol/L — ABNORMAL HIGH (ref 20.0–28.0)
FIO2: 40
O2 Saturation: 93.6 %
Patient temperature: 37
pCO2 arterial: 45.8 mmHg (ref 32.0–48.0)
pH, Arterial: 7.451 — ABNORMAL HIGH (ref 7.350–7.450)
pO2, Arterial: 68.7 mmHg — ABNORMAL LOW (ref 83.0–108.0)

## 2020-11-12 NOTE — Progress Notes (Signed)
Pulmonary Critical Care Medicine [facility] [place]   PULMONARY CRITICAL CARE SERVICE  PROGRESS NOTE  Date of Service: 11/12/2020   Collin Robertson  [id number]  DOB: 10-23-1955   DOA: 10/13/2020  Referring Physician: Carron Curie, MD  HPI: Collin Robertson is a 65 y.o. male seen for follow up of Acute on Chronic Respiratory Failure.  Remains afebrile without distress appears to be comfortable.  Patient has been off of the ventilator on 40% FiO2  Medications: Reviewed on Rounds  Physical Exam:  Vitals: Temperature is 97.3 pulse 63 respiratory 20 blood pressure is 144/89 saturations 100%  Ventilator Settings on T collar FiO2 40%  . General: Comfortable at this time . Eyes: Grossly normal lids, irises & conjunctiva . ENT: grossly tongue is normal . Neck: no obvious mass . Cardiovascular: S1 S2 normal no gallop . Respiratory: No rhonchi very coarse breath sounds are noted . Abdomen: soft . Skin: no rash seen on limited exam . Musculoskeletal: not rigid . Psychiatric:unable to assess . Neurologic: no seizure no involuntary movements         Lab Data:   Basic Metabolic Panel: Recent Labs  Lab 11/08/20 0707 11/10/20 0420  NA 135 136  K 3.8 4.2  CL 99 97*  CO2 31 33*  GLUCOSE 126* 158*  BUN 19 23  CREATININE 0.63 0.64  CALCIUM 9.2 9.4  MG 1.7 1.9    ABG: Recent Labs  Lab 11/12/20 0946  PHART 7.451*  PCO2ART 45.8  PO2ART 68.7*  HCO3 31.5*  O2SAT 93.6    Liver Function Tests: No results for input(s): AST, ALT, ALKPHOS, BILITOT, PROT, ALBUMIN in the last 168 hours. No results for input(s): LIPASE, AMYLASE in the last 168 hours. No results for input(s): AMMONIA in the last 168 hours.  CBC: Recent Labs  Lab 11/08/20 0707 11/10/20 0420  WBC 11.2* 10.4  HGB 9.8* 9.9*  HCT 29.8* 30.8*  MCV 85.9 86.5  PLT 207 242    Cardiac Enzymes: No results for input(s): CKTOTAL, CKMB, CKMBINDEX, TROPONINI in the last 168 hours.  BNP (last 3  results) No results for input(s): BNP in the last 8760 hours.  ProBNP (last 3 results) No results for input(s): PROBNP in the last 8760 hours.  Radiological Exams: No results found.  Assessment/Plan Active Problems:   Acute on chronic respiratory failure with hypoxia (HCC)   COVID-19 virus infection   Ventilator associated pneumonia (HCC)   Acute respiratory distress syndrome (ARDS) due to COVID-19 virus (HCC)   1. Acute on chronic respiratory failure hypoxia plan is going to be to continue with T collar on 40% FiO2. 2. COVID-19 virus infection recovery 3. Ventilator associated pneumonia slowly improving 4. ARDS also improving we will continue to follow along.   I have personally seen and evaluated the patient, evaluated laboratory and imaging results, formulated the assessment and plan and placed orders. The Patient requires high complexity decision making with multiple systems involvement.  Rounds were done with the Respiratory Therapy Director and Staff therapists and discussed with nursing staff also.  Yevonne Pax, MD [facility] Pulmonary Critical Care Medicine Sleep Medicine

## 2020-11-12 NOTE — Progress Notes (Addendum)
Pulmonary Critical Care Medicine [facility] [place]   PULMONARY CRITICAL CARE SERVICE  PROGRESS NOTE     Collin Robertson  [id number]  DOB: April 11, 1956   DOA: 10/13/2020  Referring Physician: Carron Curie, MD  HPI: Collin Robertson is a 65 y.o. male seen for follow up of Acute on Chronic Respiratory Failure.  Patient is resting comfortably right now without distress is afebrile  Medications: Reviewed on Rounds  Physical Exam:  Vitals: Temperature 97.3 pulse 60 respiratory 22 blood pressure is 136/90 saturations 98  Ventilator Settings on T collar off the vent 40% FiO2  . General: Comfortable at this time . Eyes: Grossly normal lids, irises & conjunctiva . ENT: grossly tongue is normal . Neck: no obvious mass . Cardiovascular: S1 S2 normal no gallop . Respiratory: Scattered rhonchi expansion is equal at this . Abdomen: soft . Skin: no rash seen on limited exam . Musculoskeletal: not rigid . Psychiatric:unable to assess . Neurologic: no seizure no involuntary movements         Lab Data:   Basic Metabolic Panel: Recent Labs  Lab 11/08/20 0707 11/10/20 0420  NA 135 136  K 3.8 4.2  CL 99 97*  CO2 31 33*  GLUCOSE 126* 158*  BUN 19 23  CREATININE 0.63 0.64  CALCIUM 9.2 9.4  MG 1.7 1.9    ABG: Recent Labs  Lab 11/12/20 0946  PHART 7.451*  PCO2ART 45.8  PO2ART 68.7*  HCO3 31.5*  O2SAT 93.6    Liver Function Tests: No results for input(s): AST, ALT, ALKPHOS, BILITOT, PROT, ALBUMIN in the last 168 hours. No results for input(s): LIPASE, AMYLASE in the last 168 hours. No results for input(s): AMMONIA in the last 168 hours.  CBC: Recent Labs  Lab 11/08/20 0707 11/10/20 0420  WBC 11.2* 10.4  HGB 9.8* 9.9*  HCT 29.8* 30.8*  MCV 85.9 86.5  PLT 207 242    Cardiac Enzymes: No results for input(s): CKTOTAL, CKMB, CKMBINDEX, TROPONINI in the last 168 hours.  BNP (last 3 results) No results for input(s): BNP in the last 8760 hours.  ProBNP  (last 3 results) No results for input(s): PROBNP in the last 8760 hours.  Radiological Exams: No results found.  Assessment/Plan Active Problems:   Acute on chronic respiratory failure with hypoxia (HCC)   COVID-19 virus infection   Ventilator associated pneumonia (HCC)   Acute respiratory distress syndrome (ARDS) due to COVID-19 virus (HCC)   1. Acute on chronic respiratory failure hypoxia we will continue with T collar titrate oxygen as tolerated. 2. COVID-19 virus infection recovery phase 3. Ventilator associated pneumonia treated we will continue to monitor 4. ARDS patient is at baseline   I have personally seen and evaluated the patient, evaluated laboratory and imaging results, formulated the assessment and plan and placed orders. The Patient requires high complexity decision making with multiple systems involvement.  Rounds were done with the Respiratory Therapy Director and Staff therapists and discussed with nursing staff also.  Yevonne Pax, MD [facility] Pulmonary Critical Care Medicine Sleep Medicine

## 2020-11-13 DIAGNOSIS — J95851 Ventilator associated pneumonia: Secondary | ICD-10-CM | POA: Diagnosis not present

## 2020-11-13 DIAGNOSIS — U071 COVID-19: Secondary | ICD-10-CM | POA: Diagnosis not present

## 2020-11-13 DIAGNOSIS — J80 Acute respiratory distress syndrome: Secondary | ICD-10-CM | POA: Diagnosis not present

## 2020-11-13 DIAGNOSIS — J9621 Acute and chronic respiratory failure with hypoxia: Secondary | ICD-10-CM | POA: Diagnosis not present

## 2020-11-13 NOTE — Progress Notes (Signed)
Pulmonary Critical Care Medicine [facility] [place]   PULMONARY CRITICAL CARE SERVICE  PROGRESS NOTE     Stanly Si  [id number]  DOB: [birth year]   DOA: 10/13/2020  Referring Physician: Carron Curie, MD  HPI: Collin Robertson is a 65 y.o. male seen for follow up of Acute on Chronic Respiratory Failure.  Patient is on T collar to go up to 60% now down to 40%  Medications: Reviewed on Rounds  Physical Exam:  Vitals: Temperature is 98.3 pulse 74 respiratory 24 blood pressure is 130/80 saturations 96%  Ventilator Settings on T collar FiO2 40%  . General: Comfortable at this time . Eyes: Grossly normal lids, irises & conjunctiva . ENT: grossly tongue is normal . Neck: no obvious mass . Cardiovascular: S1 S2 normal no gallop . Respiratory: Scattered rhonchi expansion is equal at this time . Abdomen: soft . Skin: no rash seen on limited exam . Musculoskeletal: not rigid . Psychiatric:unable to assess . Neurologic: no seizure no involuntary movements         Lab Data:   Basic Metabolic Panel: Recent Labs  Lab 11/08/20 0707 11/10/20 0420  NA 135 136  K 3.8 4.2  CL 99 97*  CO2 31 33*  GLUCOSE 126* 158*  BUN 19 23  CREATININE 0.63 0.64  CALCIUM 9.2 9.4  MG 1.7 1.9    ABG: Recent Labs  Lab 11/12/20 0946  PHART 7.451*  PCO2ART 45.8  PO2ART 68.7*  HCO3 31.5*  O2SAT 93.6    Liver Function Tests: No results for input(s): AST, ALT, ALKPHOS, BILITOT, PROT, ALBUMIN in the last 168 hours. No results for input(s): LIPASE, AMYLASE in the last 168 hours. No results for input(s): AMMONIA in the last 168 hours.  CBC: Recent Labs  Lab 11/08/20 0707 11/10/20 0420  WBC 11.2* 10.4  HGB 9.8* 9.9*  HCT 29.8* 30.8*  MCV 85.9 86.5  PLT 207 242    Cardiac Enzymes: No results for input(s): CKTOTAL, CKMB, CKMBINDEX, TROPONINI in the last 168 hours.  BNP (last 3 results) No results for input(s): BNP in the last 8760 hours.  ProBNP (last 3 results) No  results for input(s): PROBNP in the last 8760 hours.  Radiological Exams: No results found.  Assessment/Plan Active Problems:   Acute on chronic respiratory failure with hypoxia (HCC)   COVID-19 virus infection   Ventilator associated pneumonia (HCC)   Acute respiratory distress syndrome (ARDS) due to COVID-19 virus (HCC)   1. Acute on chronic respiratory failure hypoxia we will continue with the weaning of the FiO2 down. 2. COVID-19 virus infection in recovery phase 3. Ventilator associated pneumonia treated improving 4. ARDS slow improvement   I have personally seen and evaluated the patient, evaluated laboratory and imaging results, formulated the assessment and plan and placed orders. The Patient requires high complexity decision making with multiple systems involvement.  Rounds were done with the Respiratory Therapy Director and Staff therapists and discussed with nursing staff also.  Yevonne Pax, MD [facility] Pulmonary Critical Care Medicine Sleep Medicine

## 2020-11-14 DIAGNOSIS — J95851 Ventilator associated pneumonia: Secondary | ICD-10-CM | POA: Diagnosis not present

## 2020-11-14 DIAGNOSIS — J80 Acute respiratory distress syndrome: Secondary | ICD-10-CM | POA: Diagnosis not present

## 2020-11-14 DIAGNOSIS — J9621 Acute and chronic respiratory failure with hypoxia: Secondary | ICD-10-CM | POA: Diagnosis not present

## 2020-11-14 DIAGNOSIS — U071 COVID-19: Secondary | ICD-10-CM | POA: Diagnosis not present

## 2020-11-14 NOTE — Progress Notes (Signed)
Pulmonary Critical Care Medicine [facility] [place]   PULMONARY CRITICAL CARE SERVICE  PROGRESS NOTE     Collin Robertson  [id number]  DOB: 05-11-1956   DOA: 10/13/2020  Referring Physician: Carron Curie, MD  HPI: Collin Robertson is a 65 y.o. male seen for follow up of Acute on Chronic Respiratory Failure.  Patient is currently T collar has been on 40% FiO2  Medications: Reviewed on Rounds  Physical Exam:  Vitals: Temperature 96.9 pulse 67 respiratory 25 blood pressure 120/79 saturations 98%  Ventilator Settings on T collar with an FiO2 of 40%  . General: Comfortable at this time . Eyes: Grossly normal lids, irises & conjunctiva . ENT: grossly tongue is normal . Neck: no obvious mass . Cardiovascular: S1 S2 normal no gallop . Respiratory: Scattered rhonchi expansion is equal . Abdomen: soft . Skin: no rash seen on limited exam . Musculoskeletal: not rigid . Psychiatric:unable to assess . Neurologic: no seizure no involuntary movements         Lab Data:   Basic Metabolic Panel: Recent Labs  Lab 11/08/20 0707 11/10/20 0420  NA 135 136  K 3.8 4.2  CL 99 97*  CO2 31 33*  GLUCOSE 126* 158*  BUN 19 23  CREATININE 0.63 0.64  CALCIUM 9.2 9.4  MG 1.7 1.9    ABG: Recent Labs  Lab 11/12/20 0946  PHART 7.451*  PCO2ART 45.8  PO2ART 68.7*  HCO3 31.5*  O2SAT 93.6    Liver Function Tests: No results for input(s): AST, ALT, ALKPHOS, BILITOT, PROT, ALBUMIN in the last 168 hours. No results for input(s): LIPASE, AMYLASE in the last 168 hours. No results for input(s): AMMONIA in the last 168 hours.  CBC: Recent Labs  Lab 11/08/20 0707 11/10/20 0420  WBC 11.2* 10.4  HGB 9.8* 9.9*  HCT 29.8* 30.8*  MCV 85.9 86.5  PLT 207 242    Cardiac Enzymes: No results for input(s): CKTOTAL, CKMB, CKMBINDEX, TROPONINI in the last 168 hours.  BNP (last 3 results) No results for input(s): BNP in the last 8760 hours.  ProBNP (last 3 results) No results for  input(s): PROBNP in the last 8760 hours.  Radiological Exams: No results found.  Assessment/Plan Active Problems:   Acute on chronic respiratory failure with hypoxia (HCC)   COVID-19 virus infection   Ventilator associated pneumonia (HCC)   Acute respiratory distress syndrome (ARDS) due to COVID-19 virus (HCC)   1. Acute on chronic respiratory failure hypoxia we will continue with T collar titrate oxygen continue pulmonary toilet. 2. COVID-19 virus infection recovery phase 3. Ventilator associated pneumonia treated clinically is improving 4. ARDS slow improvement   I have personally seen and evaluated the patient, evaluated laboratory and imaging results, formulated the assessment and plan and placed orders. The Patient requires high complexity decision making with multiple systems involvement.  Rounds were done with the Respiratory Therapy Director and Staff therapists and discussed with nursing staff also.  Yevonne Pax, MD [facility] Pulmonary Critical Care Medicine Sleep Medicine

## 2020-11-15 DIAGNOSIS — J80 Acute respiratory distress syndrome: Secondary | ICD-10-CM | POA: Diagnosis not present

## 2020-11-15 DIAGNOSIS — J9621 Acute and chronic respiratory failure with hypoxia: Secondary | ICD-10-CM | POA: Diagnosis not present

## 2020-11-15 DIAGNOSIS — J95851 Ventilator associated pneumonia: Secondary | ICD-10-CM | POA: Diagnosis not present

## 2020-11-15 DIAGNOSIS — U071 COVID-19: Secondary | ICD-10-CM | POA: Diagnosis not present

## 2020-11-15 NOTE — Progress Notes (Signed)
Pulmonary Critical Care Medicine [facility] [place]   PULMONARY CRITICAL CARE SERVICE  PROGRESS NOTE     Collin Robertson  [id number]  DOB: [birth year]   DOA: 10/13/2020  Referring Physician: Carron Curie, MD  HPI: Collin Robertson is a 65 y.o. male seen for follow up of Acute on Chronic Respiratory Failure.  Patient is resting comfortably right now without distress on T collar has been on 40% FiO2  Medications: Reviewed on Rounds  Physical Exam:  Vitals: Temperature is 97.3 pulse 71 respiratory 25 blood pressure is 128/84 saturations 93%  Ventilator Settings on T collar FiO2 40%  . General: Comfortable at this time . Eyes: Grossly normal lids, irises & conjunctiva . ENT: grossly tongue is normal . Neck: no obvious mass . Cardiovascular: S1 S2 normal no gallop . Respiratory: Scattered rhonchi expansion is equal at this time . Abdomen: soft . Skin: no rash seen on limited exam . Musculoskeletal: not rigid . Psychiatric:unable to assess . Neurologic: no seizure no involuntary movements         Lab Data:   Basic Metabolic Panel: Recent Labs  Lab 11/10/20 0420  NA 136  K 4.2  CL 97*  CO2 33*  GLUCOSE 158*  BUN 23  CREATININE 0.64  CALCIUM 9.4  MG 1.9    ABG: Recent Labs  Lab 11/12/20 0946  PHART 7.451*  PCO2ART 45.8  PO2ART 68.7*  HCO3 31.5*  O2SAT 93.6    Liver Function Tests: No results for input(s): AST, ALT, ALKPHOS, BILITOT, PROT, ALBUMIN in the last 168 hours. No results for input(s): LIPASE, AMYLASE in the last 168 hours. No results for input(s): AMMONIA in the last 168 hours.  CBC: Recent Labs  Lab 11/10/20 0420  WBC 10.4  HGB 9.9*  HCT 30.8*  MCV 86.5  PLT 242    Cardiac Enzymes: No results for input(s): CKTOTAL, CKMB, CKMBINDEX, TROPONINI in the last 168 hours.  BNP (last 3 results) No results for input(s): BNP in the last 8760 hours.  ProBNP (last 3 results) No results for input(s): PROBNP in the last 8760  hours.  Radiological Exams: No results found.  Assessment/Plan Active Problems:   Acute on chronic respiratory failure with hypoxia (HCC)   COVID-19 virus infection   Ventilator associated pneumonia (HCC)   Acute respiratory distress syndrome (ARDS) due to COVID-19 virus (HCC)   1. Acute on chronic respiratory failure with hypoxia we will continue with T-piece using the PMV 2. COVID-19 virus infection recovery phase 3. Ventilator associated pneumonia has been treated we will monitor 4. ARDS treated continue with supportive care.   I have personally seen and evaluated the patient, evaluated laboratory and imaging results, formulated the assessment and plan and placed orders. The Patient requires high complexity decision making with multiple systems involvement.  Rounds were done with the Respiratory Therapy Director and Staff therapists and discussed with nursing staff also.  Yevonne Pax, MD [facility] Pulmonary Critical Care Medicine Sleep Medicine

## 2020-11-16 ENCOUNTER — Other Ambulatory Visit (HOSPITAL_COMMUNITY): Payer: Medicare Other

## 2020-11-16 DIAGNOSIS — J95851 Ventilator associated pneumonia: Secondary | ICD-10-CM | POA: Diagnosis not present

## 2020-11-16 DIAGNOSIS — J9621 Acute and chronic respiratory failure with hypoxia: Secondary | ICD-10-CM | POA: Diagnosis not present

## 2020-11-16 DIAGNOSIS — J80 Acute respiratory distress syndrome: Secondary | ICD-10-CM | POA: Diagnosis not present

## 2020-11-16 DIAGNOSIS — U071 COVID-19: Secondary | ICD-10-CM | POA: Diagnosis not present

## 2020-11-16 LAB — CBC
HCT: 36.4 % — ABNORMAL LOW (ref 39.0–52.0)
Hemoglobin: 11.8 g/dL — ABNORMAL LOW (ref 13.0–17.0)
MCH: 28.4 pg (ref 26.0–34.0)
MCHC: 32.4 g/dL (ref 30.0–36.0)
MCV: 87.7 fL (ref 80.0–100.0)
Platelets: 282 10*3/uL (ref 150–400)
RBC: 4.15 MIL/uL — ABNORMAL LOW (ref 4.22–5.81)
RDW: 18.2 % — ABNORMAL HIGH (ref 11.5–15.5)
WBC: 12.5 10*3/uL — ABNORMAL HIGH (ref 4.0–10.5)
nRBC: 0 % (ref 0.0–0.2)

## 2020-11-16 LAB — BASIC METABOLIC PANEL
Anion gap: 8 (ref 5–15)
BUN: 30 mg/dL — ABNORMAL HIGH (ref 8–23)
CO2: 31 mmol/L (ref 22–32)
Calcium: 9.9 mg/dL (ref 8.9–10.3)
Chloride: 99 mmol/L (ref 98–111)
Creatinine, Ser: 0.71 mg/dL (ref 0.61–1.24)
GFR, Estimated: >60 mL/min (ref >60)
Glucose, Bld: 133 mg/dL — ABNORMAL HIGH (ref 70–99)
Potassium: 4.3 mmol/L (ref 3.5–5.1)
Sodium: 138 mmol/L (ref 135–145)

## 2020-11-16 NOTE — Progress Notes (Signed)
Pulmonary Critical Care Medicine [facility] [place]   PULMONARY CRITICAL CARE SERVICE  PROGRESS NOTE     Collin Robertson  [id number]  DOB: [birth year]   DOA: 10/13/2020  Referring Physician: Carron Curie, MD  HPI: Collin Robertson is a 65 y.o. male seen for follow up of Acute on Chronic Respiratory Failure.  Doing well has been weaning on the T collar with good results.  Medications: Reviewed on Rounds  Physical Exam:  Vitals: Temperature is 97.3 pulse 66 respiratory 15 blood pressure 118/79 saturations 94  Ventilator Settings off the ventilator on T collar FiO2 40%  . General: Comfortable at this time . Eyes: Grossly normal lids, irises & conjunctiva . ENT: grossly tongue is normal . Neck: no obvious mass . Cardiovascular: S1 S2 normal no gallop . Respiratory: Scattered rhonchi expansion is equal . Abdomen: soft . Skin: no rash seen on limited exam . Musculoskeletal: not rigid . Psychiatric:unable to assess . Neurologic: no seizure no involuntary movements         Lab Data:   Basic Metabolic Panel: Recent Labs  Lab 11/10/20 0420 11/16/20 0544  NA 136 138  K 4.2 4.3  CL 97* 99  CO2 33* 31  GLUCOSE 158* 133*  BUN 23 30*  CREATININE 0.64 0.71  CALCIUM 9.4 9.9  MG 1.9  --     ABG: Recent Labs  Lab 11/12/20 0946  PHART 7.451*  PCO2ART 45.8  PO2ART 68.7*  HCO3 31.5*  O2SAT 93.6    Liver Function Tests: No results for input(s): AST, ALT, ALKPHOS, BILITOT, PROT, ALBUMIN in the last 168 hours. No results for input(s): LIPASE, AMYLASE in the last 168 hours. No results for input(s): AMMONIA in the last 168 hours.  CBC: Recent Labs  Lab 11/10/20 0420 11/16/20 0544  WBC 10.4 12.5*  HGB 9.9* 11.8*  HCT 30.8* 36.4*  MCV 86.5 87.7  PLT 242 282    Cardiac Enzymes: No results for input(s): CKTOTAL, CKMB, CKMBINDEX, TROPONINI in the last 168 hours.  BNP (last 3 results) No results for input(s): BNP in the last 8760 hours.  ProBNP (last 3  results) No results for input(s): PROBNP in the last 8760 hours.  Radiological Exams: DG CHEST PORT 1 VIEW  Result Date: 11/16/2020 CLINICAL DATA:  Respiratory failure EXAM: PORTABLE CHEST 1 VIEW COMPARISON:  Chest CT from 15 days ago FINDINGS: Cardiomegaly and vascular pedicle widening. Low volume chest with interstitial coarsening. No visible effusion or pneumothorax. IMPRESSION: Chronic cardiomegaly and chronic lung disease with low volumes. No change from prior. Electronically Signed   By: Marnee Spring M.D.   On: 11/16/2020 06:00    Assessment/Plan Active Problems:   Acute on chronic respiratory failure with hypoxia (HCC)   COVID-19 virus infection   Ventilator associated pneumonia (HCC)   Acute respiratory distress syndrome (ARDS) due to COVID-19 virus (HCC)   1. Acute on chronic respiratory failure hypoxia we will continue with T collar trials patient is on 40% FiO2. 2. COVID-19 virus infection recovery phase 3. Ventilator associated pneumonia treated 4. ARDS treated slow improvement we will continue to follow along   I have personally seen and evaluated the patient, evaluated laboratory and imaging results, formulated the assessment and plan and placed orders. The Patient requires high complexity decision making with multiple systems involvement.  Rounds were done with the Respiratory Therapy Director and Staff therapists and discussed with nursing staff also.  Yevonne Pax, MD [facility] Pulmonary Critical Care Medicine Sleep Medicine

## 2020-11-17 DIAGNOSIS — J80 Acute respiratory distress syndrome: Secondary | ICD-10-CM | POA: Diagnosis not present

## 2020-11-17 DIAGNOSIS — U071 COVID-19: Secondary | ICD-10-CM | POA: Diagnosis not present

## 2020-11-17 DIAGNOSIS — J95851 Ventilator associated pneumonia: Secondary | ICD-10-CM | POA: Diagnosis not present

## 2020-11-17 DIAGNOSIS — J9621 Acute and chronic respiratory failure with hypoxia: Secondary | ICD-10-CM | POA: Diagnosis not present

## 2020-11-17 NOTE — Progress Notes (Signed)
Pulmonary Critical Care Medicine [facility] [place]   PULMONARY CRITICAL CARE SERVICE  PROGRESS NOTE     Collin Robertson  [id number]  DOB: 02/16/1956   DOA: 10/13/2020  Referring Physician: Carron Curie, MD  HPI: Collin Robertson is a 65 y.o. male seen for follow up of Acute on Chronic Respiratory Failure.  He is capping today doing fairly well.  Has been on about 2 L some desaturations noted with some activity  Medications: Reviewed on Rounds  Physical Exam:  Vitals: Temperature is 97.0 pulse 55 respiratory 22 blood pressure is 126/82 saturations 98%  Ventilator Settings capping off the ventilator.  . General: Comfortable at this time . Eyes: Grossly normal lids, irises & conjunctiva . ENT: grossly tongue is normal . Neck: no obvious mass . Cardiovascular: S1 S2 normal no gallop . Respiratory: Scattered rhonchi expansion is equal at this time. . Abdomen: soft . Skin: no rash seen on limited exam . Musculoskeletal: not rigid . Psychiatric:unable to assess . Neurologic: no seizure no involuntary movements         Lab Data:   Basic Metabolic Panel: Recent Labs  Lab 11/16/20 0544  NA 138  K 4.3  CL 99  CO2 31  GLUCOSE 133*  BUN 30*  CREATININE 0.71  CALCIUM 9.9    ABG: Recent Labs  Lab 11/12/20 0946  PHART 7.451*  PCO2ART 45.8  PO2ART 68.7*  HCO3 31.5*  O2SAT 93.6    Liver Function Tests: No results for input(s): AST, ALT, ALKPHOS, BILITOT, PROT, ALBUMIN in the last 168 hours. No results for input(s): LIPASE, AMYLASE in the last 168 hours. No results for input(s): AMMONIA in the last 168 hours.  CBC: Recent Labs  Lab 11/16/20 0544  WBC 12.5*  HGB 11.8*  HCT 36.4*  MCV 87.7  PLT 282    Cardiac Enzymes: No results for input(s): CKTOTAL, CKMB, CKMBINDEX, TROPONINI in the last 168 hours.  BNP (last 3 results) No results for input(s): BNP in the last 8760 hours.  ProBNP (last 3 results) No results for input(s): PROBNP in the  last 8760 hours.  Radiological Exams: DG CHEST PORT 1 VIEW  Result Date: 11/16/2020 CLINICAL DATA:  Respiratory failure EXAM: PORTABLE CHEST 1 VIEW COMPARISON:  Chest CT from 15 days ago FINDINGS: Cardiomegaly and vascular pedicle widening. Low volume chest with interstitial coarsening. No visible effusion or pneumothorax. IMPRESSION: Chronic cardiomegaly and chronic lung disease with low volumes. No change from prior. Electronically Signed   By: Marnee Spring M.D.   On: 11/16/2020 06:00    Assessment/Plan Active Problems:   Acute on chronic respiratory failure with hypoxia (HCC)   COVID-19 virus infection   Ventilator associated pneumonia (HCC)   Acute respiratory distress syndrome (ARDS) due to COVID-19 virus (HCC)   1. Acute on chronic respiratory failure with hypoxia the patient is going to be continued to be weaned on capping trials 2 L.  Continue to advance as tolerated continue with pulmonary toilet supportive care. 2. COVID-19 virus infection recovery we will continue with supportive care per 3. Ventilator associated pneumonia treated we will continue to monitor 4. ARDS patient is at baseline   I have personally seen and evaluated the patient, evaluated laboratory and imaging results, formulated the assessment and plan and placed orders. The Patient requires high complexity decision making with multiple systems involvement.  Rounds were done with the Respiratory Therapy Director and Staff therapists and discussed with nursing staff also.  Yevonne Pax, MD [facility] Pulmonary Critical  Care Medicine Sleep Medicine

## 2020-11-18 ENCOUNTER — Other Ambulatory Visit (HOSPITAL_COMMUNITY): Payer: Medicare Other

## 2020-11-18 DIAGNOSIS — U071 COVID-19: Secondary | ICD-10-CM | POA: Diagnosis not present

## 2020-11-18 DIAGNOSIS — J80 Acute respiratory distress syndrome: Secondary | ICD-10-CM | POA: Diagnosis not present

## 2020-11-18 DIAGNOSIS — J95851 Ventilator associated pneumonia: Secondary | ICD-10-CM | POA: Diagnosis not present

## 2020-11-18 DIAGNOSIS — J9621 Acute and chronic respiratory failure with hypoxia: Secondary | ICD-10-CM | POA: Diagnosis not present

## 2020-11-18 LAB — NOVEL CORONAVIRUS, NAA (HOSP ORDER, SEND-OUT TO REF LAB; TAT 18-24 HRS): SARS-CoV-2, NAA: NOT DETECTED

## 2020-11-18 NOTE — Progress Notes (Signed)
Pulmonary Critical Care Medicine [facility] [place]   PULMONARY CRITICAL CARE SERVICE  PROGRESS NOTE     Collin Robertson  [id number]  DOB: 1956/06/04   DOA: 10/13/2020  Referring Physician: Carron Curie, MD  HPI: Collin Robertson is a 64 y.o. male seen for follow up of Acute on Chronic Respiratory Failure.  Patient is capping on 6 L O2 good saturation  Medications: Reviewed on Rounds  Physical Exam:  Vitals: Temperature is 97.2 pulse 61 respiratory 25 blood pressure is 127/81 saturations 100%  Ventilator Settings capping on 6 L O2  . General: Comfortable at this time . Eyes: Grossly normal lids, irises & conjunctiva . ENT: grossly tongue is normal . Neck: no obvious mass . Cardiovascular: S1 S2 normal no gallop . Respiratory: Coarse rhonchi expansion is equal . Abdomen: soft . Skin: no rash seen on limited exam . Musculoskeletal: not rigid . Psychiatric:unable to assess . Neurologic: no seizure no involuntary movements         Lab Data:   Basic Metabolic Panel: Recent Labs  Lab 11/16/20 0544  NA 138  K 4.3  CL 99  CO2 31  GLUCOSE 133*  BUN 30*  CREATININE 0.71  CALCIUM 9.9    ABG: Recent Labs  Lab 11/12/20 0946  PHART 7.451*  PCO2ART 45.8  PO2ART 68.7*  HCO3 31.5*  O2SAT 93.6    Liver Function Tests: No results for input(s): AST, ALT, ALKPHOS, BILITOT, PROT, ALBUMIN in the last 168 hours. No results for input(s): LIPASE, AMYLASE in the last 168 hours. No results for input(s): AMMONIA in the last 168 hours.  CBC: Recent Labs  Lab 11/16/20 0544  WBC 12.5*  HGB 11.8*  HCT 36.4*  MCV 87.7  PLT 282    Cardiac Enzymes: No results for input(s): CKTOTAL, CKMB, CKMBINDEX, TROPONINI in the last 168 hours.  BNP (last 3 results) No results for input(s): BNP in the last 8760 hours.  ProBNP (last 3 results) No results for input(s): PROBNP in the last 8760 hours.  Radiological Exams: No results found.  Assessment/Plan Active  Problems:   Acute on chronic respiratory failure with hypoxia (HCC)   COVID-19 virus infection   Ventilator associated pneumonia (HCC)   Acute respiratory distress syndrome (ARDS) due to COVID-19 virus (HCC)   1. Acute on chronic respiratory failure hypoxia plan is going to be to continue with the capping as ordered. 2. COVID-19 virus infection recovery phase 3. Ventilator associated pneumonia no change 4. ARDS treated slow improved   I have personally seen and evaluated the patient, evaluated laboratory and imaging results, formulated the assessment and plan and placed orders. The Patient requires high complexity decision making with multiple systems involvement.  Rounds were done with the Respiratory Therapy Director and Staff therapists and discussed with nursing staff also.  Yevonne Pax, MD [facility] Pulmonary Critical Care Medicine Sleep Medicine

## 2020-11-21 LAB — NOVEL CORONAVIRUS, NAA (HOSP ORDER, SEND-OUT TO REF LAB; TAT 18-24 HRS): SARS-CoV-2, NAA: NOT DETECTED

## 2021-01-26 DIAGNOSIS — K8689 Other specified diseases of pancreas: Secondary | ICD-10-CM | POA: Insufficient documentation

## 2021-02-22 DIAGNOSIS — J8410 Pulmonary fibrosis, unspecified: Secondary | ICD-10-CM | POA: Insufficient documentation

## 2021-06-17 IMAGING — DX DG CHEST 1V PORT
1 series · 1 of 1 positions shown · non-contrast
Comparison: 10/14/2020

CLINICAL DATA: Hypoxia.  RHZLI-PM pneumonia

EXAM:
PORTABLE CHEST 1 VIEW

[chest ap]
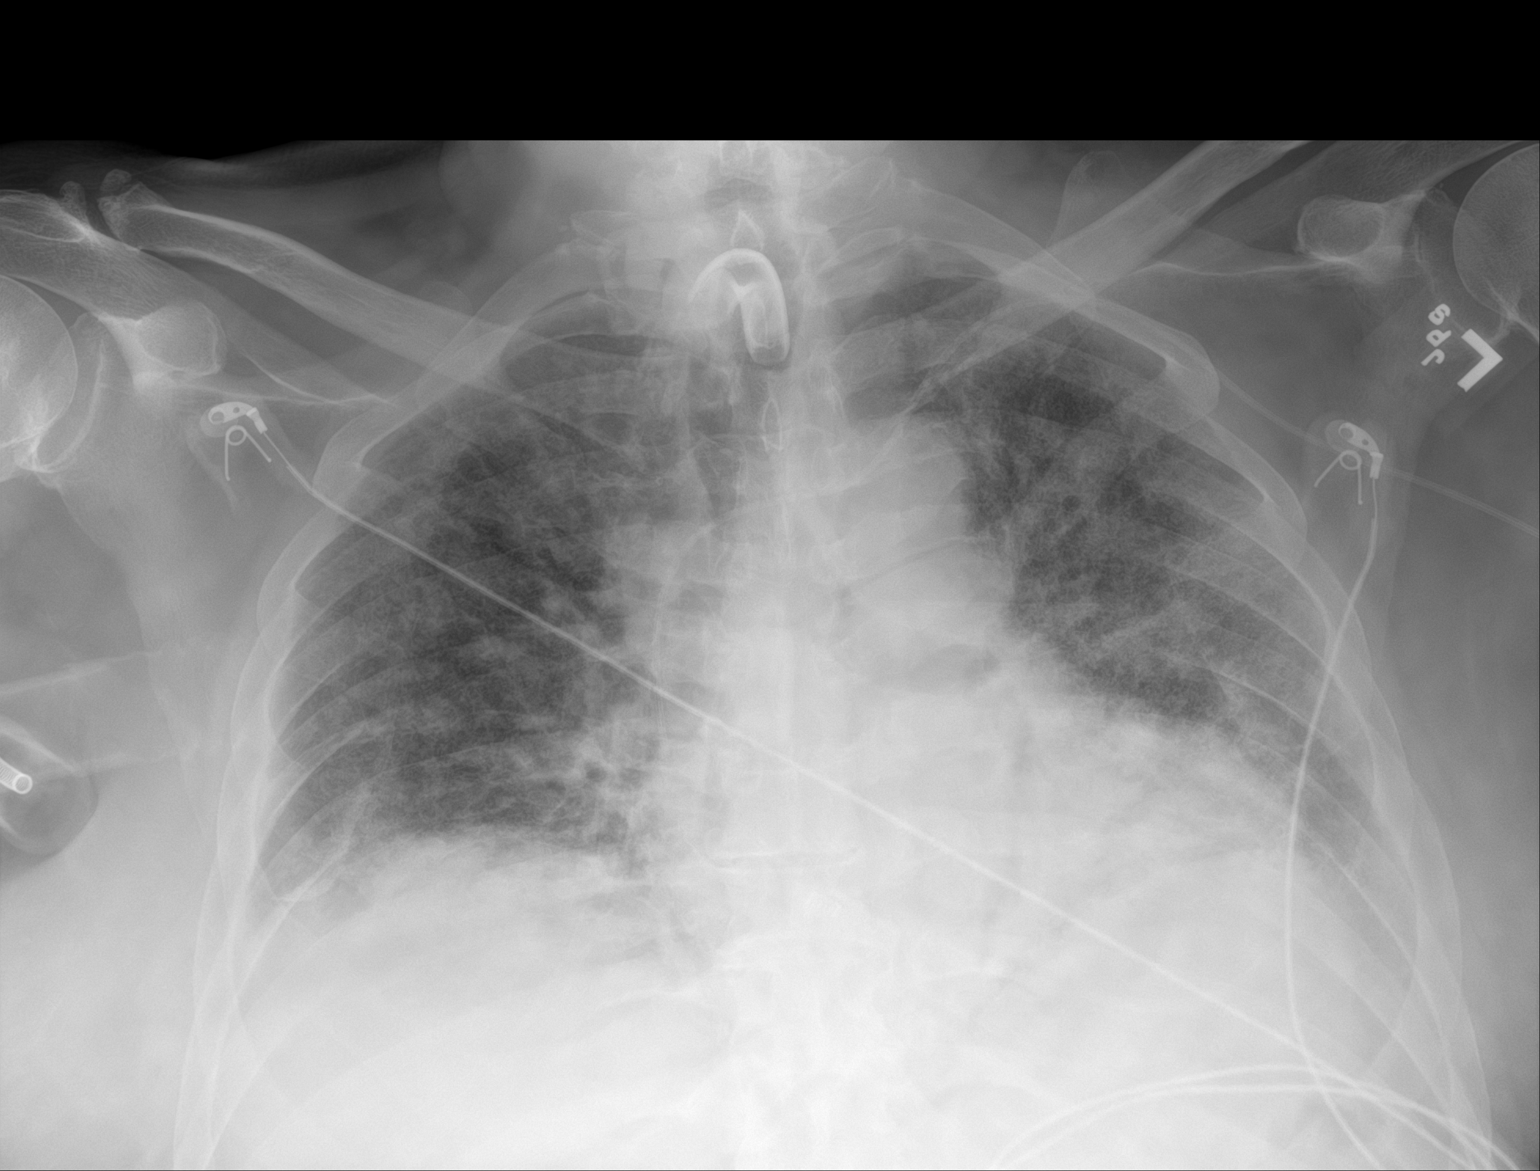

[1 of 1 positions shown; findings below may reference images not displayed]

FINDINGS: Low volume chest with diffuse hazy and interstitial opacity.
Cardiomegaly accentuated by technique. Tracheostomy tube and left
PICC in good position. No visible effusion or pneumothorax.
IMPRESSION: Stable hardware positioning, low lung volumes, and generalized
airspace disease.

## 2021-07-11 IMAGING — DX DG CHEST 1V PORT
1 series · 1 of 1 positions shown · non-contrast
Comparison: 11/16/2020

CLINICAL DATA: Shortness of breath, chest pain

EXAM:
PORTABLE CHEST 1 VIEW

[chest]
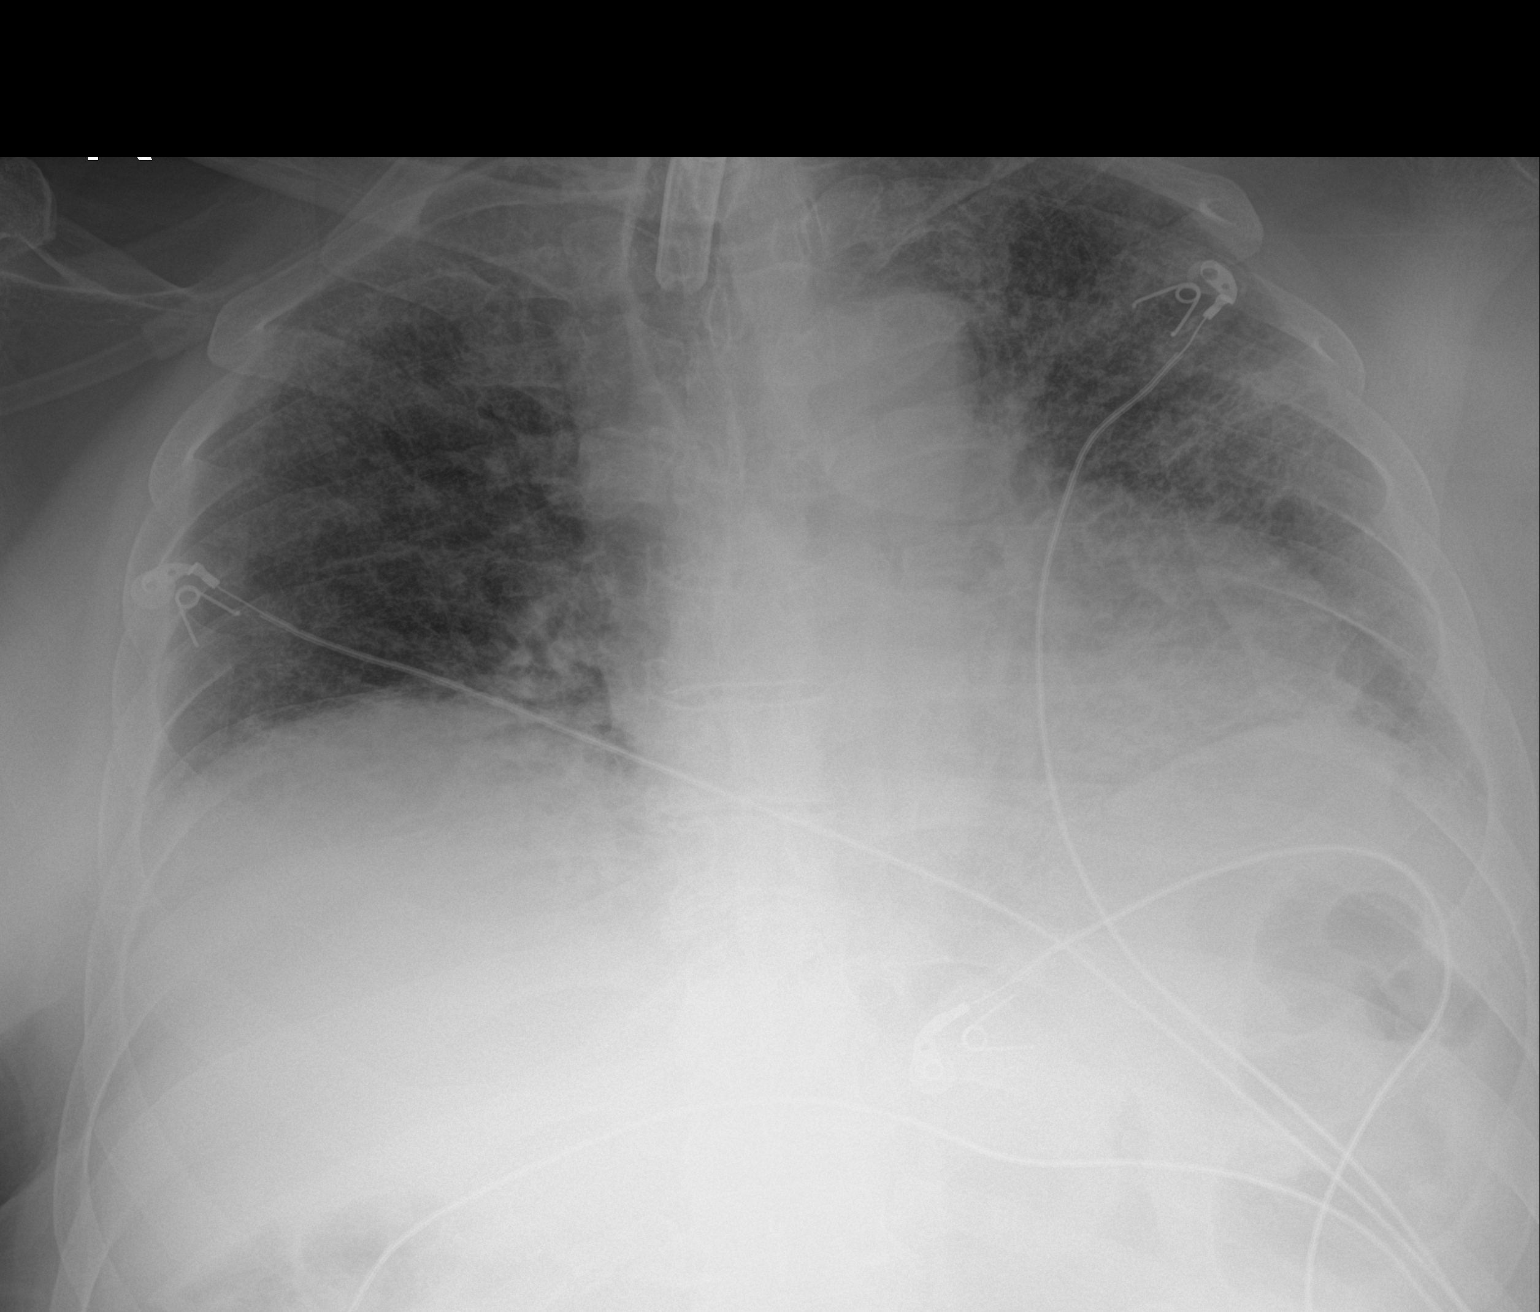

[1 of 1 positions shown; findings below may reference images not displayed]

FINDINGS: No significant interval change in low volume AP portable examination
with tracheostomy and diffuse bilateral interstitial opacity.
Cardiomegaly.
IMPRESSION: 1. No significant interval change in low volume AP portable
examination with tracheostomy and diffuse bilateral interstitial
opacity. No new airspace opacity.

2.  Cardiomegaly.

## 2021-09-20 DIAGNOSIS — N1831 Chronic kidney disease, stage 3a: Secondary | ICD-10-CM | POA: Insufficient documentation

## 2021-09-20 DIAGNOSIS — J449 Chronic obstructive pulmonary disease, unspecified: Secondary | ICD-10-CM | POA: Insufficient documentation

## 2021-09-20 DIAGNOSIS — J9611 Chronic respiratory failure with hypoxia: Secondary | ICD-10-CM | POA: Insufficient documentation

## 2021-09-20 DIAGNOSIS — I119 Hypertensive heart disease without heart failure: Secondary | ICD-10-CM | POA: Insufficient documentation

## 2022-12-12 ENCOUNTER — Other Ambulatory Visit: Payer: Self-pay

## 2022-12-12 ENCOUNTER — Encounter: Payer: Self-pay | Admitting: Urology

## 2022-12-12 ENCOUNTER — Ambulatory Visit (HOSPITAL_COMMUNITY)
Admission: RE | Admit: 2022-12-12 | Discharge: 2022-12-12 | Disposition: A | Discharge status: Home / Self Care | Payer: Medicare Other | Source: Ambulatory Visit | Attending: Urology | Admitting: Urology

## 2022-12-12 ENCOUNTER — Ambulatory Visit (INDEPENDENT_AMBULATORY_CARE_PROVIDER_SITE_OTHER): Payer: Medicare Other | Admitting: Urology

## 2022-12-12 VITALS — BP 155/97 | HR 75

## 2022-12-12 DIAGNOSIS — N200 Calculus of kidney: Secondary | ICD-10-CM

## 2022-12-12 LAB — URINALYSIS, ROUTINE W REFLEX MICROSCOPIC
Bilirubin, UA: NEGATIVE
Glucose, UA: NEGATIVE
Ketones, UA: NEGATIVE
Nitrite, UA: NEGATIVE
Specific Gravity, UA: 1.02 (ref 1.005–1.030)
Urobilinogen, Ur: 0.2 mg/dL (ref 0.2–1.0)
pH, UA: 6 (ref 5.0–7.5)

## 2022-12-12 LAB — MICROSCOPIC EXAMINATION: WBC, UA: >30 /hpf — AB (ref 0–5)

## 2022-12-12 MED ORDER — ONDANSETRON HCL 4 MG PO TABS: 4.0000 mg | ORAL_TABLET | Freq: Three times a day (TID) | ORAL | 0 refills | Status: DC | PRN

## 2022-12-12 MED ORDER — TAMSULOSIN HCL 0.4 MG PO CAPS
0.4000 mg | ORAL_CAPSULE | Freq: Every day | ORAL | 1 refills | Status: DC
Start: 2022-12-12 — End: 2023-01-18

## 2022-12-12 MED ORDER — OXYCODONE-ACETAMINOPHEN 5-325 MG PO TABS
1.0000 | ORAL_TABLET | ORAL | 0 refills | Status: DC | PRN
Start: 2022-12-12 — End: 2023-01-09

## 2022-12-12 NOTE — Patient Instructions (Signed)
ESWL for Kidney Stones  Extracorporeal shock wave lithotripsy (ESWL) is a treatment that can help break up kidney stones that are too large to pass on their own.  This is a nonsurgical procedure that breaks up a kidney stone with shock waves. These shock waves pass through your body and focus on the kidney stone. They cause the kidney stone to break into smaller pieces (fragments) while it is still in the urinary tract. The fragments of stone can pass more easily out of your body in the urine. Tell a health care provider about: Any allergies you have. All medicines you are taking, including vitamins, herbs, eye drops, creams, and over-the-counter medicines. Any problems you or family members have had with anesthetic medicines. Any bleeding problems you have. Any surgeries you have had. Any medical conditions you have. Whether you are pregnant or may be pregnant. What are the risks? Your health care provider will talk with you about risks. These may include: Infection. Bleeding from the kidney. Bruising of the kidney or skin. Scarring of the kidney. This can lead to: Increased blood pressure. Poor kidney function. Return (recurrence) of kidney stones. Damage to other structures or organs. This may include the liver, colon, spleen, or pancreas. Blockage (obstruction) of the tube that carries urine from the kidney to the bladder (ureter). Failure of the kidney stone to break into fragments. What happens before the procedure? When to stop eating and drinking Follow instructions from your health care provider about what you may eat and drink. These may include: 8 hours before your procedure Stop eating most foods. Do not eat meat, fried foods, or fatty foods. Eat only light foods, such as toast or crackers. All liquids are okay except energy drinks and alcohol. 6 hours before your procedure Stop eating. Drink only clear liquids, such as water, clear fruit juice, black coffee, plain tea,  and sports drinks. Do not drink energy drinks or alcohol. 2 hours before your procedure Stop drinking all liquids. You may be allowed to take medicines with small sips of water. If you do not follow your health care provider's instructions, your procedure may be delayed or canceled. Medicines Ask your health care provider about: Changing or stopping your regular medicines. These include any diabetes medicines or blood thinners you take. Taking medicines such as aspirin and ibuprofen. These medicines can thin your blood. Do not take them unless your health care provider tells you to. Taking over-the-counter medicines, vitamins, herbs, and supplements. Tests You may have tests, such as: Blood tests. Urine tests. Imaging tests. This may include a CT scan. Surgery safety Ask your health care provider: How your surgery site will be marked. What steps will be taken to help prevent infection. These steps may include: Washing skin with a soap that kills germs. Receiving antibiotics. General instructions If you will be going home right after the procedure, plan to have a responsible adult: Take you home from the hospital or clinic. You will not be allowed to drive. Care for you for the time you are told. What happens during the procedure?  An IV will be inserted into one of your veins. You may be given: A sedative. This helps you relax. Anesthesia. This will: Numb certain areas of your body. Make you fall asleep for surgery. A water-filled cushion may be placed behind your kidney or on your abdomen. In some cases, you may be placed in a tub of lukewarm water. Your body will be positioned in a way that makes it   easier to target the kidney stone. An X-ray or ultrasound exam will be done to locate your stone. Shock waves will be aimed at the stone. If you are awake, you may feel a tapping sensation as the shock waves pass through your body. A small mesh tube (stent) may be placed in your  ureter. This will help keep urine flowing from the kidney if the fragments of the stone have been blocking the ureter. The stent will be removed at a later time by your health care provider. The procedure may vary among health care providers and hospitals. What happens after the procedure? Your blood pressure, heart rate, breathing rate, and blood oxygen level will be monitored until you leave the hospital or clinic. You may have an X-ray after the procedure to see how many of the kidney stones were broken up. This will also show how much of the stone has passed. If there are still large fragments after treatment, you may need to have a second procedure at a later time. This information is not intended to replace advice given to you by your health care provider. Make sure you discuss any questions you have with your health care provider. Document Revised: 12/08/2021 Document Reviewed: 12/08/2021 Elsevier Patient Education  2023 [employer]  

## 2022-12-12 NOTE — Progress Notes (Signed)
I spoke with Collin Robertson. We have discussed possible surgery dates and 12/19/2022 was agreed upon by all parties. Patient given information about surgery date, what to expect pre-operatively and post operatively.    We discussed that a pre-op nurse will be calling to set up the pre-op visit that will take place prior to surgery. Informed patient that our office will communicate any additional care to be provided after surgery.    Patients questions or concerns were discussed during our call. Advised to call our office should there be any additional information, questions or concerns that arise. Patient verbalized understanding.

## 2022-12-12 NOTE — H&P (View-Only) (Signed)
 12/12/2022 1:02 PM   Collin Robertson 11/15/1955 [id number]  Referring provider: Mahoney, Mark, DO [address] [place] [address]  Nephrolithiasis   HPI: Collin Robertson is a 66yo here for evaluation of nephrolithiasis. He has had stone events since age 22. Starting Saturday he developed left flank pain. He presented to the ER at [facility] and was diagnosed with a 5mm left proximal ureteral calculus. He has been on home O2 for the past year after COVID. He was given tramadol, flomax. Currently the pain is mild, sharp, intermittent. No hematuria   PMH: Past Medical History:  Diagnosis Date   Acute on chronic respiratory failure with hypoxia (HCC)    Acute respiratory distress syndrome (ARDS) due to COVID-19 virus (HCC)    COVID-19 virus infection    Ventilator associated pneumonia (HCC)     Surgical History: No past surgical history on file.  Home Medications:  Allergies as of 12/12/2022   Not on File      Medication List        Accurate as of December 12, 2022  1:02 PM. If you have any questions, ask your nurse or doctor.          spironolactone 25 MG tablet Commonly known as: ALDACTONE TAKE 1 TABLET BY MOUTH EVERY DAY FOR 30 DAYS   tamsulosin 0.4 MG Caps capsule Commonly known as: FLOMAX Take 0.4 mg by mouth daily.        Allergies: Not on File  Family History: No family history on file.  Social History:  has no history on file for tobacco use, alcohol use, and drug use.  ROS: All other review of systems were reviewed and are negative except what is noted above in HPI  Physical Exam: BP (!) 167/100   Constitutional:  Alert and oriented, No acute distress. HEENT: [place] AT, moist mucus membranes.  Trachea midline, no masses. Cardiovascular: No clubbing, cyanosis, or edema. Respiratory: Normal respiratory effort, no increased work of breathing. GI: Abdomen is soft, nontender, nondistended, no abdominal masses GU: No CVA tenderness.  Lymph: No  cervical or inguinal lymphadenopathy. Skin: No rashes, bruises or suspicious lesions. Neurologic: Grossly intact, no focal deficits, moving all 4 extremities. Psychiatric: Normal mood and affect.  Laboratory Data: Lab Results  Component Value Date   WBC 12.5 (H) 11/16/2020   HGB 11.8 (L) 11/16/2020   HCT 36.4 (L) 11/16/2020   MCV 87.7 11/16/2020   PLT 282 11/16/2020    Lab Results  Component Value Date   CREATININE 0.71 11/16/2020    No results found for: "PSA"  No results found for: "TESTOSTERONE"  Lab Results  Component Value Date   HGBA1C 8.2 (H) 10/25/2020    Urinalysis    Component Value Date/Time   COLORURINE YELLOW 11/02/2020 0900   APPEARANCEUR CLEAR 11/02/2020 0900   LABSPEC 1.006 11/02/2020 0900   PHURINE 7.0 11/02/2020 0900   GLUCOSEU NEGATIVE 11/02/2020 0900   HGBUR LARGE (A) 11/02/2020 0900   BILIRUBINUR NEGATIVE 11/02/2020 0900   KETONESUR NEGATIVE 11/02/2020 0900   PROTEINUR 30 (A) 11/02/2020 0900   NITRITE NEGATIVE 11/02/2020 0900   LEUKOCYTESUR SMALL (A) 11/02/2020 0900    Lab Results  Component Value Date   BACTERIA RARE (A) 11/02/2020    Pertinent Imaging: CT 4/21 and KUB today: Images reviewed and discussed with the patient  Results for orders placed during the hospital encounter of 10/13/20  DG Abd 1 View  Narrative CLINICAL DATA:  Ileus  EXAM: ABDOMEN - 1   VIEW  COMPARISON:  10/15/2020  FINDINGS: Nonobstructive bowel gas pattern. No organomegaly or free air. Gastrostomy tube projects over the left upper quadrant. No acute bony abnormality.  IMPRESSION: Nonobstructive bowel gas pattern.  No evidence of ileus currently.   Electronically Signed By: Kevin  Dover M.D. On: 10/19/2020 11:09  No results found for this or any previous visit.  No results found for this or any previous visit.  No results found for this or any previous visit.  No results found for this or any previous visit.  No valid procedures  specified. No results found for this or any previous visit.  No results found for this or any previous visit.   Assessment & Plan:    1. Kidney stones -We discussed the management of kidney stones. These options include observation, ureteroscopy, shockwave lithotripsy (ESWL) and percutaneous nephrolithotomy (PCNL). We discussed which options are relevant to the patient's stone(s). We discussed the natural history of kidney stones as well as the complications of untreated stones and the impact on quality of life without treatment as well as with each of the above listed treatments. We also discussed the efficacy of each treatment in its ability to clear the stone burden. With any of these management options I discussed the signs and symptoms of infection and the need for emergent treatment should these be experienced. For each option we discussed the ability of each procedure to clear the patient of their stone burden.   For observation I described the risks which include but are not limited to silent renal damage, life-threatening infection, need for emergent surgery, failure to pass stone and pain.   For ureteroscopy I described the risks which include bleeding, infection, damage to contiguous structures, positioning injury, ureteral stricture, ureteral avulsion, ureteral injury, need for prolonged ureteral stent, inability to perform ureteroscopy, need for an interval procedure, inability to clear stone burden, stent discomfort/pain, heart attack, stroke, pulmonary embolus and the inherent risks with general anesthesia.   For shockwave lithotripsy I described the risks which include arrhythmia, kidney contusion, kidney hemorrhage, need for transfusion, pain, inability to adequately break up stone, inability to pass stone fragments, Steinstrasse, infection associated with obstructing stones, need for alternate surgical procedure, need for repeat shockwave lithotripsy, MI, CVA, PE and the inherent  risks with anesthesia/conscious sedation.   For PCNL I described the risks including positioning injury, pneumothorax, hydrothorax, need for chest tube, inability to clear stone burden, renal laceration, arterial venous fistula or malformation, need for embolization of kidney, loss of kidney or renal function, need for repeat procedure, need for prolonged nephrostomy tube, ureteral avulsion, MI, CVA, PE and the inherent risks of general anesthesia.   - The patient would like to proceed with left ESWL   No follow-ups on file.  Collin Shipton, MD  [facility]    

## 2022-12-12 NOTE — H&P (View-Only) (Signed)
 12/12/2022 1:02 PM   Collin Robertson 07/18/1956 [id number]  Referring provider: Mahoney, Mark, DO [address] [place] [address]  Nephrolithiasis   HPI: [name] is a 66yo here for evaluation of nephrolithiasis. He has had stone events since age 22. Starting Saturday he developed left flank pain. He presented to the ER at [facility] and was diagnosed with a 5mm left proximal ureteral calculus. He has been on home O2 for the past year after COVID. He was given tramadol, flomax. Currently the pain is mild, sharp, intermittent. No hematuria   PMH: Past Medical History:  Diagnosis Date   Acute on chronic respiratory failure with hypoxia (HCC)    Acute respiratory distress syndrome (ARDS) due to COVID-19 virus (HCC)    COVID-19 virus infection    Ventilator associated pneumonia (HCC)     Surgical History: No past surgical history on file.  Home Medications:  Allergies as of 12/12/2022   Not on File      Medication List        Accurate as of December 12, 2022  1:02 PM. If you have any questions, ask your nurse or doctor.          spironolactone 25 MG tablet Commonly known as: ALDACTONE TAKE 1 TABLET BY MOUTH EVERY DAY FOR 30 DAYS   tamsulosin 0.4 MG Caps capsule Commonly known as: FLOMAX Take 0.4 mg by mouth daily.        Allergies: Not on File  Family History: No family history on file.  Social History:  has no history on file for tobacco use, alcohol use, and drug use.  ROS: All other review of systems were reviewed and are negative except what is noted above in HPI  Physical Exam: BP (!) 167/100   Constitutional:  Alert and oriented, No acute distress. HEENT: [place] AT, moist mucus membranes.  Trachea midline, no masses. Cardiovascular: No clubbing, cyanosis, or edema. Respiratory: Normal respiratory effort, no increased work of breathing. GI: Abdomen is soft, nontender, nondistended, no abdominal masses GU: No CVA tenderness.  Lymph: No  cervical or inguinal lymphadenopathy. Skin: No rashes, bruises or suspicious lesions. Neurologic: Grossly intact, no focal deficits, moving all 4 extremities. Psychiatric: Normal mood and affect.  Laboratory Data: Lab Results  Component Value Date   WBC 12.5 (H) 11/16/2020   HGB 11.8 (L) 11/16/2020   HCT 36.4 (L) 11/16/2020   MCV 87.7 11/16/2020   PLT 282 11/16/2020    Lab Results  Component Value Date   CREATININE 0.71 11/16/2020    No results found for: "PSA"  No results found for: "TESTOSTERONE"  Lab Results  Component Value Date   HGBA1C 8.2 (H) 10/25/2020    Urinalysis    Component Value Date/Time   COLORURINE YELLOW 11/02/2020 0900   APPEARANCEUR CLEAR 11/02/2020 0900   LABSPEC 1.006 11/02/2020 0900   PHURINE 7.0 11/02/2020 0900   GLUCOSEU NEGATIVE 11/02/2020 0900   HGBUR LARGE (A) 11/02/2020 0900   BILIRUBINUR NEGATIVE 11/02/2020 0900   KETONESUR NEGATIVE 11/02/2020 0900   PROTEINUR 30 (A) 11/02/2020 0900   NITRITE NEGATIVE 11/02/2020 0900   LEUKOCYTESUR SMALL (A) 11/02/2020 0900    Lab Results  Component Value Date   BACTERIA RARE (A) 11/02/2020    Pertinent Imaging: CT 4/21 and KUB today: Images reviewed and discussed with the patient  Results for orders placed during the hospital encounter of 10/13/20  DG Abd 1 View  Narrative CLINICAL DATA:  Ileus  EXAM: ABDOMEN - 1   VIEW  COMPARISON:  10/15/2020  FINDINGS: Nonobstructive bowel gas pattern. No organomegaly or free air. Gastrostomy tube projects over the left upper quadrant. No acute bony abnormality.  IMPRESSION: Nonobstructive bowel gas pattern.  No evidence of ileus currently.   Electronically Signed By: Kevin  Dover M.D. On: 10/19/2020 11:09  No results found for this or any previous visit.  No results found for this or any previous visit.  No results found for this or any previous visit.  No results found for this or any previous visit.  No valid procedures  specified. No results found for this or any previous visit.  No results found for this or any previous visit.   Assessment & Plan:    1. Kidney stones -We discussed the management of kidney stones. These options include observation, ureteroscopy, shockwave lithotripsy (ESWL) and percutaneous nephrolithotomy (PCNL). We discussed which options are relevant to the patient's stone(s). We discussed the natural history of kidney stones as well as the complications of untreated stones and the impact on quality of life without treatment as well as with each of the above listed treatments. We also discussed the efficacy of each treatment in its ability to clear the stone burden. With any of these management options I discussed the signs and symptoms of infection and the need for emergent treatment should these be experienced. For each option we discussed the ability of each procedure to clear the patient of their stone burden.   For observation I described the risks which include but are not limited to silent renal damage, life-threatening infection, need for emergent surgery, failure to pass stone and pain.   For ureteroscopy I described the risks which include bleeding, infection, damage to contiguous structures, positioning injury, ureteral stricture, ureteral avulsion, ureteral injury, need for prolonged ureteral stent, inability to perform ureteroscopy, need for an interval procedure, inability to clear stone burden, stent discomfort/pain, heart attack, stroke, pulmonary embolus and the inherent risks with general anesthesia.   For shockwave lithotripsy I described the risks which include arrhythmia, kidney contusion, kidney hemorrhage, need for transfusion, pain, inability to adequately break up stone, inability to pass stone fragments, Steinstrasse, infection associated with obstructing stones, need for alternate surgical procedure, need for repeat shockwave lithotripsy, MI, CVA, PE and the inherent  risks with anesthesia/conscious sedation.   For PCNL I described the risks including positioning injury, pneumothorax, hydrothorax, need for chest tube, inability to clear stone burden, renal laceration, arterial venous fistula or malformation, need for embolization of kidney, loss of kidney or renal function, need for repeat procedure, need for prolonged nephrostomy tube, ureteral avulsion, MI, CVA, PE and the inherent risks of general anesthesia.   - The patient would like to proceed with left ESWL   No follow-ups on file.  Caidynce Muzyka, MD   [facility] [place]   

## 2022-12-12 NOTE — Progress Notes (Signed)
12/12/2022 1:02 PM   Fay Records December 08, 1955 [id number]  Referring provider: Lorelei Pont, DO [address] [place],  [place] [address]  Nephrolithiasis   HPI: Mr Crumble is a 66yo here for evaluation of nephrolithiasis. He has had stone events since age 60. Starting Saturday he developed left flank pain. He presented to the ER at [facility] and was diagnosed with a 5mm left proximal ureteral calculus. He has been on home O2 for the past year after COVID. He was given tramadol, flomax. Currently the pain is mild, sharp, intermittent. No hematuria   PMH: Past Medical History:  Diagnosis Date   Acute on chronic respiratory failure with hypoxia (HCC)    Acute respiratory distress syndrome (ARDS) due to COVID-19 virus (HCC)    COVID-19 virus infection    Ventilator associated pneumonia [facility])     Surgical History: No past surgical history on file.  Home Medications:  Allergies as of 12/12/2022   Not on File      Medication List        Accurate as of December 12, 2022  1:02 PM. If you have any questions, ask your nurse or doctor.          spironolactone 25 MG tablet Commonly known as: ALDACTONE TAKE 1 TABLET BY MOUTH EVERY DAY FOR 30 DAYS   tamsulosin 0.4 MG Caps capsule Commonly known as: FLOMAX Take 0.4 mg by mouth daily.        Allergies: Not on File  Family History: No family history on file.  Social History:  has no history on file for tobacco use, alcohol use, and drug use.  ROS: All other review of systems were reviewed and are negative except what is noted above in HPI  Physical Exam: BP (!) 167/100   Constitutional:  Alert and oriented, No acute distress. HEENT: [place] AT, moist mucus membranes.  Trachea midline, no masses. Cardiovascular: No clubbing, cyanosis, or edema. Respiratory: Normal respiratory effort, no increased work of breathing. GI: Abdomen is soft, nontender, nondistended, no abdominal masses GU: No CVA tenderness.  Lymph: No  cervical or inguinal lymphadenopathy. Skin: No rashes, bruises or suspicious lesions. Neurologic: Grossly intact, no focal deficits, moving all 4 extremities. Psychiatric: Normal mood and affect.  Laboratory Data: Lab Results  Component Value Date   WBC 12.5 (H) 11/16/2020   HGB 11.8 (L) 11/16/2020   HCT 36.4 (L) 11/16/2020   MCV 87.7 11/16/2020   PLT 282 11/16/2020    Lab Results  Component Value Date   CREATININE 0.71 11/16/2020    No results found for: "PSA"  No results found for: "TESTOSTERONE"  Lab Results  Component Value Date   HGBA1C 8.2 (H) 10/25/2020    Urinalysis    Component Value Date/Time   COLORURINE YELLOW 11/02/2020 0900   APPEARANCEUR CLEAR 11/02/2020 0900   LABSPEC 1.006 11/02/2020 0900   PHURINE 7.0 11/02/2020 0900   GLUCOSEU NEGATIVE 11/02/2020 0900   HGBUR LARGE (A) 11/02/2020 0900   BILIRUBINUR NEGATIVE 11/02/2020 0900   KETONESUR NEGATIVE 11/02/2020 0900   PROTEINUR 30 (A) 11/02/2020 0900   NITRITE NEGATIVE 11/02/2020 0900   LEUKOCYTESUR SMALL (A) 11/02/2020 0900    Lab Results  Component Value Date   BACTERIA RARE (A) 11/02/2020    Pertinent Imaging: CT 4/21 and KUB today: Images reviewed and discussed with the patient  Results for orders placed during the hospital encounter of 10/13/20  DG Abd 1 View  Narrative CLINICAL DATA:  Ileus  EXAM: ABDOMEN - 1  VIEW  COMPARISON:  10/15/2020  FINDINGS: Nonobstructive bowel gas pattern. No organomegaly or free air. Gastrostomy tube projects over the left upper quadrant. No acute bony abnormality.  IMPRESSION: Nonobstructive bowel gas pattern.  No evidence of ileus currently.   Electronically Signed By: Charlett Nose M.D. On: 10/19/2020 11:09  No results found for this or any previous visit.  No results found for this or any previous visit.  No results found for this or any previous visit.  No results found for this or any previous visit.  No valid procedures  specified. No results found for this or any previous visit.  No results found for this or any previous visit.   Assessment & Plan:    1. Kidney stones -We discussed the management of kidney stones. These options include observation, ureteroscopy, shockwave lithotripsy (ESWL) and percutaneous nephrolithotomy (PCNL). We discussed which options are relevant to the patient's stone(s). We discussed the natural history of kidney stones as well as the complications of untreated stones and the impact on quality of life without treatment as well as with each of the above listed treatments. We also discussed the efficacy of each treatment in its ability to clear the stone burden. With any of these management options I discussed the signs and symptoms of infection and the need for emergent treatment should these be experienced. For each option we discussed the ability of each procedure to clear the patient of their stone burden.   For observation I described the risks which include but are not limited to silent renal damage, life-threatening infection, need for emergent surgery, failure to pass stone and pain.   For ureteroscopy I described the risks which include bleeding, infection, damage to contiguous structures, positioning injury, ureteral stricture, ureteral avulsion, ureteral injury, need for prolonged ureteral stent, inability to perform ureteroscopy, need for an interval procedure, inability to clear stone burden, stent discomfort/pain, heart attack, stroke, pulmonary embolus and the inherent risks with general anesthesia.   For shockwave lithotripsy I described the risks which include arrhythmia, kidney contusion, kidney hemorrhage, need for transfusion, pain, inability to adequately break up stone, inability to pass stone fragments, Steinstrasse, infection associated with obstructing stones, need for alternate surgical procedure, need for repeat shockwave lithotripsy, MI, CVA, PE and the inherent  risks with anesthesia/conscious sedation.   For PCNL I described the risks including positioning injury, pneumothorax, hydrothorax, need for chest tube, inability to clear stone burden, renal laceration, arterial venous fistula or malformation, need for embolization of kidney, loss of kidney or renal function, need for repeat procedure, need for prolonged nephrostomy tube, ureteral avulsion, MI, CVA, PE and the inherent risks of general anesthesia.   - The patient would like to proceed with left ESWL   No follow-ups on file.  Wilkie Aye, MD  [facility] [place]

## 2022-12-15 ENCOUNTER — Encounter (HOSPITAL_COMMUNITY): Payer: Self-pay

## 2022-12-15 ENCOUNTER — Encounter (HOSPITAL_COMMUNITY)
Admission: RE | Admit: 2022-12-15 | Discharge: 2022-12-15 | Disposition: A | Discharge status: Home / Self Care | Payer: Medicare Other | Source: Ambulatory Visit | Attending: Urology | Admitting: Urology

## 2022-12-19 ENCOUNTER — Ambulatory Visit (HOSPITAL_COMMUNITY)
Admission: RE | Admit: 2022-12-19 | Discharge: 2022-12-19 | Disposition: A | Discharge status: Home / Self Care | Payer: Medicare Other | Attending: Urology | Admitting: Urology

## 2022-12-19 ENCOUNTER — Encounter (HOSPITAL_COMMUNITY): Payer: Self-pay | Admitting: Urology

## 2022-12-19 ENCOUNTER — Encounter (HOSPITAL_COMMUNITY)
Admission: RE | Disposition: A | Discharge status: Home / Self Care | Payer: Self-pay | Source: Home / Self Care | Attending: Urology

## 2022-12-19 ENCOUNTER — Ambulatory Visit (HOSPITAL_COMMUNITY): Payer: Medicare Other

## 2022-12-19 DIAGNOSIS — E669 Obesity, unspecified: Secondary | ICD-10-CM | POA: Insufficient documentation

## 2022-12-19 DIAGNOSIS — N200 Calculus of kidney: Secondary | ICD-10-CM

## 2022-12-19 DIAGNOSIS — N201 Calculus of ureter: Secondary | ICD-10-CM | POA: Insufficient documentation

## 2022-12-19 DIAGNOSIS — Z9981 Dependence on supplemental oxygen: Secondary | ICD-10-CM | POA: Insufficient documentation

## 2022-12-19 DIAGNOSIS — Z8616 Personal history of COVID-19: Secondary | ICD-10-CM | POA: Insufficient documentation

## 2022-12-19 DIAGNOSIS — G4730 Sleep apnea, unspecified: Secondary | ICD-10-CM | POA: Insufficient documentation

## 2022-12-19 DIAGNOSIS — Z6841 Body Mass Index (BMI) 40.0 and over, adult: Secondary | ICD-10-CM | POA: Insufficient documentation

## 2022-12-19 DIAGNOSIS — I10 Essential (primary) hypertension: Secondary | ICD-10-CM | POA: Insufficient documentation

## 2022-12-19 DIAGNOSIS — E119 Type 2 diabetes mellitus without complications: Secondary | ICD-10-CM | POA: Insufficient documentation

## 2022-12-19 HISTORY — PX: EXTRACORPOREAL SHOCK WAVE LITHOTRIPSY: SHX1557

## 2022-12-19 LAB — GLUCOSE, CAPILLARY: Glucose-Capillary: 127 mg/dL — ABNORMAL HIGH (ref 70–99)

## 2022-12-19 SURGERY — LITHOTRIPSY, ESWL
Anesthesia: LOCAL | Laterality: Left

## 2022-12-19 MED ORDER — DIAZEPAM 5 MG PO TABS
10.0000 mg | ORAL_TABLET | Freq: Once | ORAL | Status: AC
  Administered 2022-12-19: 10 mg via ORAL
  Filled 2022-12-19: qty 2

## 2022-12-19 MED ORDER — DIPHENHYDRAMINE HCL 25 MG PO CAPS
25.0000 mg | ORAL_CAPSULE | ORAL | Status: AC
  Administered 2022-12-19: 25 mg via ORAL
  Filled 2022-12-19: qty 1

## 2022-12-19 NOTE — Progress Notes (Signed)
Patient's litho procedure was cancelled due to BP reading elevations. Patient denies any pain or headache post op.

## 2022-12-19 NOTE — Interval H&P Note (Signed)
History and Physical Interval Note:  12/19/2022 8:30 AM  Collin Robertson  has presented today for surgery, with the diagnosis of Left ureteral calculus.  The various methods of treatment have been discussed with the patient and family. After consideration of risks, benefits and other options for treatment, the patient has consented to  Procedure(s): EXTRACORPOREAL SHOCK WAVE LITHOTRIPSY (ESWL) (Left) as a surgical intervention.  The patient's history has been reviewed, patient examined, no change in status, stable for surgery.  I have reviewed the patient's chart and labs.  Questions were answered to the patient's satisfaction.     Wilkie Aye

## 2022-12-20 ENCOUNTER — Encounter (HOSPITAL_COMMUNITY): Payer: Self-pay | Admitting: Urology

## 2022-12-29 ENCOUNTER — Telehealth: Payer: Self-pay

## 2022-12-29 NOTE — Telephone Encounter (Signed)
I spoke with Collin Robertson. We have discussed possible surgery dates and 01/09/2023 was agreed upon by all parties. Patient given information about surgery date, what to expect pre-operatively and post operatively.    We discussed that a pre-op nurse will be calling to set up the pre-op visit that will take place prior to surgery. Informed patient that our office will communicate any additional care to be provided after surgery.    Patients questions or concerns were discussed during our call. Advised to call our office should there be any additional information, questions or concerns that arise. Patient verbalized understanding.

## 2023-01-01 ENCOUNTER — Telehealth: Payer: Self-pay

## 2023-01-01 NOTE — Telephone Encounter (Addendum)
I spoke with Collin Robertson. We have discussed possible surgery dates and 05/21 was agreed upon by all parties. Patient given information about surgery date, what to expect pre-operatively and post operatively.    We discussed that a pre-op nurse will be calling to set up the pre-op visit that will take place prior to surgery. Informed patient that our office will communicate any additional care to be provided after surgery.    Patients questions or concerns were discussed during our call. Advised to call our office should there be any additional information, questions or concerns that arise. Patient verbalized understanding.

## 2023-01-03 ENCOUNTER — Encounter: Payer: Medicare Other | Admitting: Urology

## 2023-01-05 ENCOUNTER — Encounter (HOSPITAL_COMMUNITY): Payer: Self-pay

## 2023-01-05 ENCOUNTER — Other Ambulatory Visit: Payer: Self-pay

## 2023-01-05 ENCOUNTER — Encounter (HOSPITAL_COMMUNITY)
Admission: RE | Admit: 2023-01-05 | Discharge: 2023-01-05 | Disposition: A | Discharge status: Home / Self Care | Payer: Medicare Other | Source: Ambulatory Visit | Attending: Urology | Admitting: Urology

## 2023-01-09 ENCOUNTER — Ambulatory Visit (HOSPITAL_COMMUNITY): Payer: Medicare Other

## 2023-01-09 ENCOUNTER — Encounter (HOSPITAL_COMMUNITY)
Admission: RE | Disposition: A | Discharge status: Home / Self Care | Payer: Self-pay | Source: Home / Self Care | Attending: Urology

## 2023-01-09 ENCOUNTER — Ambulatory Visit (HOSPITAL_COMMUNITY)
Admission: RE | Admit: 2023-01-09 | Discharge: 2023-01-09 | Disposition: A | Discharge status: Home / Self Care | Payer: Medicare Other | Attending: Urology | Admitting: Urology

## 2023-01-09 DIAGNOSIS — Z931 Gastrostomy status: Secondary | ICD-10-CM | POA: Diagnosis not present

## 2023-01-09 DIAGNOSIS — N200 Calculus of kidney: Secondary | ICD-10-CM | POA: Diagnosis present

## 2023-01-09 HISTORY — PX: EXTRACORPOREAL SHOCK WAVE LITHOTRIPSY: SHX1557

## 2023-01-09 LAB — GLUCOSE, CAPILLARY: Glucose-Capillary: 132 mg/dL — ABNORMAL HIGH (ref 70–99)

## 2023-01-09 SURGERY — LITHOTRIPSY, ESWL
Anesthesia: LOCAL | Laterality: Left

## 2023-01-09 MED ORDER — DIPHENHYDRAMINE HCL 25 MG PO CAPS
25.0000 mg | ORAL_CAPSULE | ORAL | Status: AC
  Administered 2023-01-09: 25 mg via ORAL
  Filled 2023-01-09: qty 1

## 2023-01-09 MED ORDER — OXYCODONE-ACETAMINOPHEN 5-325 MG PO TABS: 1.0000 | ORAL_TABLET | ORAL | 0 refills | Status: AC | PRN

## 2023-01-09 MED ORDER — DIAZEPAM 5 MG PO TABS
10.0000 mg | ORAL_TABLET | Freq: Once | ORAL | Status: AC
  Administered 2023-01-09: 10 mg via ORAL
  Filled 2023-01-09: qty 2

## 2023-01-09 MED ORDER — SODIUM CHLORIDE 0.9 % IV SOLN: INTRAVENOUS | Status: DC

## 2023-01-09 NOTE — Interval H&P Note (Signed)
History and Physical Interval Note:  01/09/2023 8:00 AM  Collin Robertson  has presented today for surgery, with the diagnosis of Left ureteral calculus.  The various methods of treatment have been discussed with the patient and family. After consideration of risks, benefits and other options for treatment, the patient has consented to  Procedure(s): EXTRACORPOREAL SHOCK WAVE LITHOTRIPSY (ESWL) (Left) as a surgical intervention.  The patient's history has been reviewed, patient examined, no change in status, stable for surgery.  I have reviewed the patient's chart and labs.  Questions were answered to the patient's satisfaction.     Wilkie Aye

## 2023-01-10 ENCOUNTER — Encounter (HOSPITAL_COMMUNITY): Payer: Self-pay | Admitting: Urology

## 2023-01-17 ENCOUNTER — Other Ambulatory Visit: Payer: Self-pay | Admitting: Urology

## 2023-01-17 DIAGNOSIS — N200 Calculus of kidney: Secondary | ICD-10-CM

## 2023-01-29 ENCOUNTER — Ambulatory Visit (INDEPENDENT_AMBULATORY_CARE_PROVIDER_SITE_OTHER): Payer: Medicare Other | Admitting: Urology

## 2023-01-29 ENCOUNTER — Ambulatory Visit (HOSPITAL_COMMUNITY)
Admission: RE | Admit: 2023-01-29 | Discharge: 2023-01-29 | Disposition: A | Discharge status: Home / Self Care | Payer: Medicare Other | Source: Ambulatory Visit | Attending: Urology | Admitting: Urology

## 2023-01-29 ENCOUNTER — Encounter: Payer: Self-pay | Admitting: Urology

## 2023-01-29 VITALS — BP 154/100 | HR 65

## 2023-01-29 DIAGNOSIS — N200 Calculus of kidney: Secondary | ICD-10-CM | POA: Insufficient documentation

## 2023-01-29 DIAGNOSIS — Z87442 Personal history of urinary calculi: Secondary | ICD-10-CM

## 2023-01-29 DIAGNOSIS — Z09 Encounter for follow-up examination after completed treatment for conditions other than malignant neoplasm: Secondary | ICD-10-CM

## 2023-01-29 LAB — URINALYSIS, ROUTINE W REFLEX MICROSCOPIC
Bilirubin, UA: NEGATIVE
Glucose, UA: NEGATIVE
Ketones, UA: NEGATIVE
Nitrite, UA: NEGATIVE
Specific Gravity, UA: 1.025 (ref 1.005–1.030)
Urobilinogen, Ur: 0.2 mg/dL (ref 0.2–1.0)
pH, UA: 6 (ref 5.0–7.5)

## 2023-01-29 LAB — MICROSCOPIC EXAMINATION: WBC, UA: >30 /hpf — AB (ref 0–5)

## 2023-01-29 NOTE — Patient Instructions (Signed)

## 2023-01-29 NOTE — Progress Notes (Unsigned)
01/29/2023 2:05 PM   Fay Records August 16, 1956 [id number]  Referring provider: Lorelei Pont, DO [address] [place],  [place] [address]  Followup nephrolithiasis   HPI: Mr Giovanelli is a 66yo here for followup for nephrolithiasis. He passed a calculus 1 week ago. He denies any flank pain. No dysuria. No hematuria   PMH: Past Medical History:  Diagnosis Date   Acid reflux    Acute on chronic respiratory failure with hypoxia (HCC)    Acute respiratory distress syndrome (ARDS) due to COVID-19 virus (HCC)    Anxiety    Colitis    COVID-19 virus infection    Diabetes mellitus without complication (HCC)    DVT (deep venous thrombosis) (HCC)    Hypertension    Sleep apnea    Ventilator associated pneumonia [facility])     Surgical History: Past Surgical History:  Procedure Laterality Date   EXTRACORPOREAL SHOCK WAVE LITHOTRIPSY Left 12/19/2022   Procedure: EXTRACORPOREAL SHOCK WAVE LITHOTRIPSY (ESWL);  Surgeon: Malen Gauze, MD;  Location: [facility];  Service: Urology;  Laterality: Left;   EXTRACORPOREAL SHOCK WAVE LITHOTRIPSY Left 01/09/2023   Procedure: EXTRACORPOREAL SHOCK WAVE LITHOTRIPSY (ESWL);  Surgeon: Malen Gauze, MD;  Location: [facility];  Service: Urology;  Laterality: Left;    Home Medications:  Allergies as of 01/29/2023       Reactions   Codeine Hives, Itching, Rash, Swelling   Hydrocodone Itching        Medication List        Accurate as of January 29, 2023  2:05 PM. If you have any questions, ask your nurse or doctor.          amLODipine 10 MG tablet Commonly known as: NORVASC Take 10 mg by mouth daily.   cyanocobalamin 1000 MCG tablet Commonly known as: VITAMIN B12 Take by mouth.   D 1000 25 MCG (1000 UT) capsule Generic drug: Cholecalciferol Take 1 capsule by mouth daily.   famotidine 20 MG tablet Commonly known as: PEPCID Take 20 mg by mouth daily.   metoprolol tartrate 25 MG tablet Commonly known as: LOPRESSOR Take 25 mg by  mouth 2 (two) times daily.   ondansetron 4 MG tablet Commonly known as: Zofran Take 1 tablet (4 mg total) by mouth every 8 (eight) hours as needed for nausea or vomiting.   oxyCODONE-acetaminophen 5-325 MG tablet Commonly known as: Percocet Take 1 tablet by mouth every 4 (four) hours as needed.   pregabalin 50 MG capsule Commonly known as: LYRICA Take 1 capsule by mouth 2 (two) times daily.   spironolactone 25 MG tablet Commonly known as: ALDACTONE TAKE 1 TABLET BY MOUTH EVERY DAY FOR 30 DAYS   tamsulosin 0.4 MG Caps capsule Commonly known as: FLOMAX TAKE 1 CAPSULE BY MOUTH EVERY DAY        Allergies:  Allergies  Allergen Reactions   Codeine Hives, Itching, Rash and Swelling   Hydrocodone Itching    Family History: No family history on file.  Social History:  has no history on file for tobacco use, alcohol use, and drug use.  ROS: All other review of systems were reviewed and are negative except what is noted above in HPI  Physical Exam: BP (!) 155/92   Pulse 69   Constitutional:  Alert and oriented, No acute distress. HEENT: [place] AT, moist mucus membranes.  Trachea midline, no masses. Cardiovascular: No clubbing, cyanosis, or edema. Respiratory: Normal respiratory effort, no increased work of breathing. GI: Abdomen is soft, nontender, nondistended, no  abdominal masses GU: No CVA tenderness.  Lymph: No cervical or inguinal lymphadenopathy. Skin: No rashes, bruises or suspicious lesions. Neurologic: Grossly intact, no focal deficits, moving all 4 extremities. Psychiatric: Normal mood and affect.  Laboratory Data: Lab Results  Component Value Date   WBC 12.5 (H) 11/16/2020   HGB 11.8 (L) 11/16/2020   HCT 36.4 (L) 11/16/2020   MCV 87.7 11/16/2020   PLT 282 11/16/2020    Lab Results  Component Value Date   CREATININE 0.71 11/16/2020    No results found for: "PSA"  No results found for: "TESTOSTERONE"  Lab Results  Component Value Date   HGBA1C 8.2  (H) 10/25/2020    Urinalysis    Component Value Date/Time   COLORURINE YELLOW 11/02/2020 0900   APPEARANCEUR Clear 12/12/2022 1306   LABSPEC 1.006 11/02/2020 0900   PHURINE 7.0 11/02/2020 0900   GLUCOSEU Negative 12/12/2022 1306   HGBUR LARGE (A) 11/02/2020 0900   BILIRUBINUR Negative 12/12/2022 1306   KETONESUR NEGATIVE 11/02/2020 0900   PROTEINUR 1+ (A) 12/12/2022 1306   PROTEINUR 30 (A) 11/02/2020 0900   NITRITE Negative 12/12/2022 1306   NITRITE NEGATIVE 11/02/2020 0900   LEUKOCYTESUR 2+ (A) 12/12/2022 1306   LEUKOCYTESUR SMALL (A) 11/02/2020 0900    Lab Results  Component Value Date   LABMICR See below: 12/12/2022   WBCUA >30 (A) 12/12/2022   LABEPIT 0-10 12/12/2022   BACTERIA Few (A) 12/12/2022    Pertinent Imaging: KUB today: Images reviewed and discussed with the patient Results for orders placed during the hospital encounter of 01/09/23  DG Abd 1 View  Narrative CLINICAL DATA:  74034 Kidney stone 16109  EXAM: ABDOMEN - 1 VIEW  COMPARISON:  [facility], most recently 12/19/2022.  CT [facility], 12/09/2022.  FINDINGS: Nonobstructed bowel-gas pattern.  Similar size and distribution of BILATERAL nephrolithiasis, with largest lesion on the RIGHT measuring approximately 1.9 cm. Additional scattered smaller nephroliths. Pelvic phleboliths.  No interval osseous abnormality.  IMPRESSION: Similar size and distribution of BILATERAL renal calculi.   Electronically Signed By: Roanna Banning M.D. On: 01/09/2023 08:17  No results found for this or any previous visit.  No results found for this or any previous visit.  No results found for this or any previous visit.  No results found for this or any previous visit.  No valid procedures specified. No results found for this or any previous visit.  No results found for this or any previous visit.   Assessment & Plan:    1. Kidney stones Followup 6 weeks with renal US - Urinalysis, Routine w reflex  microscopic   No follow-ups on file.  Wilkie Aye, MD  [facility] [place]

## 2023-02-28 ENCOUNTER — Ambulatory Visit (HOSPITAL_COMMUNITY)
Admission: RE | Admit: 2023-02-28 | Discharge: 2023-02-28 | Disposition: A | Discharge status: Home / Self Care | Payer: Medicare Other | Source: Ambulatory Visit | Attending: Urology | Admitting: Urology

## 2023-02-28 DIAGNOSIS — N200 Calculus of kidney: Secondary | ICD-10-CM | POA: Insufficient documentation

## 2023-03-19 NOTE — Progress Notes (Unsigned)
Name: Collin Robertson DOB: [birth year] MRN: [id number]  History of Present Illness: Collin Robertson is a 67 y.o. male who presents today for follow up visit at [facility]. - GU History: 1. Kidney stones.  Recent history:  - 12/19/2022: Went for left ESWL by Dr. Ronne Binning however procedure aborted due to patient's high blood pressure.  - 01/09/2023: Underwent left ESWL by Dr. Ronne Binning for left lower ureteral stone.  - 01/29/2023: Postop visit with Dr. Ronne Binning. RUS "Redemonstrated bilateral nephrolithiasis measuring up to 18 mm on the right and 11 mm on the left." The plan was f/u with RUS in 6 weeks.  - 02/28/2023: RUS showed bilateral nephrolithiasis (up to 2.1 cm in right kidney and 1.5 cm in left kidney). No hydronephrosis bilaterally.   Today: He denies recent episode of stone pain / passage. He denies acute flank pain / abdominal pain. He denies fevers.   He denies increased urinary urgency, frequency, nocturia, dysuria, gross hematuria, hesitancy, straining to void, or sensations of incomplete emptying.   Fall Screening: Do you usually have a device to assist in your mobility? No   Medications: Current Outpatient Medications  Medication Sig Dispense Refill   amLODipine (NORVASC) 10 MG tablet Take 10 mg by mouth daily.     Cholecalciferol (D 1000) 25 MCG (1000 UT) capsule Take 1 capsule by mouth daily.     cyanocobalamin (VITAMIN B12) 1000 MCG tablet Take by mouth.     famotidine (PEPCID) 20 MG tablet Take 20 mg by mouth daily.     metoprolol tartrate (LOPRESSOR) 25 MG tablet Take 25 mg by mouth 2 (two) times daily.     ondansetron (ZOFRAN) 4 MG tablet Take 1 tablet (4 mg total) by mouth every 8 (eight) hours as needed for nausea or vomiting. 30 tablet 0   pregabalin (LYRICA) 50 MG capsule Take 1 capsule by mouth 2 (two) times daily.     oxyCODONE-acetaminophen (PERCOCET) 5-325 MG tablet Take 1 tablet by mouth every 4 (four) hours as needed. (Patient not taking:  Reported on 03/20/2023) 30 tablet 0   spironolactone (ALDACTONE) 25 MG tablet TAKE 1 TABLET BY MOUTH EVERY DAY FOR 30 DAYS (Patient not taking: Reported on 03/20/2023)     tamsulosin (FLOMAX) 0.4 MG CAPS capsule TAKE 1 CAPSULE BY MOUTH EVERY DAY (Patient not taking: Reported on 03/20/2023) 90 capsule 1   No current facility-administered medications for this visit.    Allergies: Allergies  Allergen Reactions   Codeine Hives, Itching, Rash and Swelling   Hydrocodone Itching    Past Medical History:  Diagnosis Date   Acid reflux    Acute on chronic respiratory failure with hypoxia (HCC)    Acute respiratory distress syndrome (ARDS) due to COVID-19 virus (HCC)    Anxiety    Colitis    COVID-19 virus infection    Diabetes mellitus without complication (HCC)    DVT (deep venous thrombosis) (HCC)    Hypertension    Sleep apnea    Ventilator associated pneumonia [facility])    Past Surgical History:  Procedure Laterality Date   EXTRACORPOREAL SHOCK WAVE LITHOTRIPSY Left 12/19/2022   Procedure: EXTRACORPOREAL SHOCK WAVE LITHOTRIPSY (ESWL);  Surgeon: Malen Gauze, MD;  Location: [facility];  Service: Urology;  Laterality: Left;   EXTRACORPOREAL SHOCK WAVE LITHOTRIPSY Left 01/09/2023   Procedure: EXTRACORPOREAL SHOCK WAVE LITHOTRIPSY (ESWL);  Surgeon: Malen Gauze, MD;  Location: [facility];  Service: Urology;  Laterality: Left;   History reviewed. No pertinent family history.  Social History   Socioeconomic History   Marital status: Married    Spouse name: Not on file   Number of children: Not on file   Years of education: Not on file   Highest education level: Not on file  Occupational History   Not on file  Tobacco Use   Smoking status: Never   Smokeless tobacco: Never  Vaping Use   Vaping status: Never Used  Substance and Sexual Activity   Alcohol use: Not Currently   Drug use: Never   Sexual activity: Not Currently  Other Topics Concern   Not on file  Social History  Narrative   Not on file   Social Determinants of Health   Financial Resource Strain: Not on file  Food Insecurity: Not on file  Transportation Needs: Not on file  Physical Activity: Not on file  Stress: Not on file  Social Connections: Not on file  Intimate Partner Violence: Not on file    SUBJECTIVE  Review of Systems Constitutional: Patient denies any unintentional weight loss or change in strength lntegumentary: Patient denies any rashes or pruritus Cardiovascular: Patient denies chest pain or syncope Respiratory: Patient denies shortness of breath Gastrointestinal: Patient denies nausea, vomiting, constipation, or diarrhea Musculoskeletal: Patient denies muscle cramps or weakness Neurologic: Patient denies convulsions or seizures Psychiatric: Patient denies memory problems Allergic/Immunologic: Patient denies recent allergic reaction(s) Hematologic/Lymphatic: Patient denies bleeding tendencies Endocrine: Patient denies heat/cold intolerance  GU: As per HPI.  OBJECTIVE Vitals:   03/20/23 1421  BP: (!) 162/119  Pulse: 90  Temp: 98 F (36.7 C)   There is no height or weight on file to calculate BMI.  Physical Examination  Constitutional: No obvious distress; patient is non-toxic appearing  Cardiovascular: No visible lower extremity edema.  Respiratory: The patient does not have audible wheezing/stridor; respirations do not appear labored; on oxygen nasal cannula Gastrointestinal: Abdomen non-distended Musculoskeletal: Normal ROM of UEs  Skin: No obvious rashes/open sores  Neurologic: CN 2-12 grossly intact Psychiatric: Answered questions appropriately with normal affect  Hematologic/Lymphatic/Immunologic: No obvious bruises or sites of spontaneous bleeding  UA: positive for 11-30 WBC/hpf, 11-30 RBC/hpf, bacteria (few)   ASSESSMENT Kidney stones - Plan: Urinalysis, Routine w reflex microscopic, Microscopic Examination  Nephrolithiasis - Plan: Urinalysis,  Routine w reflex microscopic, Microscopic Examination  BP elevated. He reports that he did not take his blood pressure medication yet today.  We reviewed recent imaging results; he still has large bilateral kidney stones, no hydronephrosis.   We discussed the various treatment options including observation, shock wave lithotripsy, ureteroscopic stone manipulation, and percutaneous nephrolithotomy. We discussed possible risks and benefits of each option including but not limited to: including pain, infection, sepsis, UTI, ureter perforation, need for stenting, post-op ureteral stricture, hematuria.   Will consult with Dr. Ronne Binning and notify patient of his recommendation for next step re: stone management.  He was advised to contact urology provider or go to the ER if He develops fever >101F, uncontrollable pain, or other significantly concerning symptoms.  He verbalized understanding and agreement. All questions were answered.   PLAN Advised the following: Consult sent to Dr. Ronne Binning to determine next steps.  Orders Placed This Encounter  Procedures   Microscopic Examination   Urinalysis, Routine w reflex microscopic    Total time spent caring for the patient today was over 30 minutes. This includes time spent on the date of the visit reviewing the patient's chart before the visit, time spent during the visit, and time spent after the  visit on documentation. Over 50% of that time was spent in face-to-face time with this patient for direct counseling. E&M based on time and complexity of medical decision making.  It has been explained that the patient is to follow regularly with their PCP in addition to all other providers involved in their care and to follow instructions provided by these respective offices. Patient advised to contact urology clinic if any urologic-pertaining questions, concerns, new symptoms or problems arise in the interim period.  There are no Patient Instructions on  file for this visit.  Electronically signed by:  Donnita Falls, MSN, FNP-C, CUNP 03/20/2023 3:48 PM

## 2023-03-20 ENCOUNTER — Encounter: Payer: Self-pay | Admitting: Urology

## 2023-03-20 ENCOUNTER — Ambulatory Visit (INDEPENDENT_AMBULATORY_CARE_PROVIDER_SITE_OTHER): Payer: Medicare Other | Admitting: Urology

## 2023-03-20 VITALS — BP 162/119 | HR 90 | Temp 98.0°F

## 2023-03-20 DIAGNOSIS — N200 Calculus of kidney: Secondary | ICD-10-CM

## 2023-03-20 LAB — URINALYSIS, ROUTINE W REFLEX MICROSCOPIC
Bilirubin, UA: NEGATIVE
Glucose, UA: NEGATIVE
Ketones, UA: NEGATIVE
Nitrite, UA: NEGATIVE
Specific Gravity, UA: 1.02 (ref 1.005–1.030)
Urobilinogen, Ur: 0.2 mg/dL (ref 0.2–1.0)
pH, UA: 6 (ref 5.0–7.5)

## 2023-03-20 LAB — MICROSCOPIC EXAMINATION

## 2023-04-12 ENCOUNTER — Other Ambulatory Visit: Payer: Self-pay | Admitting: Urology

## 2023-04-12 DIAGNOSIS — N200 Calculus of kidney: Secondary | ICD-10-CM

## 2023-04-17 ENCOUNTER — Telehealth: Payer: Self-pay

## 2023-04-17 NOTE — Telephone Encounter (Signed)
Patient is made aware, appointment scheduled for follow up in 3 months with KUB and voiced understanding.

## 2023-04-17 NOTE — Telephone Encounter (Signed)
-----   Message from Donnita Falls sent at 04/12/2023  1:17 PM EDT ----- Please let patient know that Dr. Ronne Binning was consulted and does not advise stone intervention at this time; he would like patient to follow up with him in 3 months with KUB. Order placed; please assist with scheduling. Thank you. ----- Message ----- From: Malen Gauze, MD Sent: 04/12/2023  10:36 AM EDT To: Donnita Falls, FNP  I would see him in 3 months with KUB ----- Message ----- From: Donnita Falls, FNP Sent: 03/20/2023   2:48 PM EDT To: Malen Gauze, MD  - 01/09/2023: Left ESWL. - 01/29/2023: RUS "Redemonstrated bilateral nephrolithiasis measuring up to 18 mm on the right and 11 mm on the left."   - 02/28/2023: RUS showed bilateral nephrolithiasis (up to 2.1 cm in right kidney and 1.5 cm in left kidney). No hydronephrosis.   Asymptomatic today. Patient would like to have stones addressed. Please advise re: next steps. Thanks.

## 2023-07-18 ENCOUNTER — Ambulatory Visit: Payer: Medicare Other | Admitting: Urology

## 2023-09-26 ENCOUNTER — Ambulatory Visit (HOSPITAL_COMMUNITY)
Admission: RE | Admit: 2023-09-26 | Discharge: 2023-09-26 | Disposition: A | Discharge status: Home / Self Care | Payer: Medicare Other | Source: Ambulatory Visit | Attending: Urology | Admitting: Urology

## 2023-09-26 ENCOUNTER — Ambulatory Visit: Payer: Medicare Other | Admitting: Urology

## 2023-09-26 VITALS — BP 161/93 | HR 78

## 2023-09-26 DIAGNOSIS — N200 Calculus of kidney: Secondary | ICD-10-CM

## 2023-09-26 LAB — URINALYSIS, ROUTINE W REFLEX MICROSCOPIC
Bilirubin, UA: NEGATIVE
Glucose, UA: NEGATIVE
Ketones, UA: NEGATIVE
Nitrite, UA: NEGATIVE
Specific Gravity, UA: 1.02 (ref 1.005–1.030)
Urobilinogen, Ur: 0.2 mg/dL (ref 0.2–1.0)
pH, UA: 7.5 (ref 5.0–7.5)

## 2023-09-26 LAB — MICROSCOPIC EXAMINATION

## 2023-09-26 MED ORDER — TAMSULOSIN HCL 0.4 MG PO CAPS: 0.4000 mg | ORAL_CAPSULE | Freq: Every day | ORAL | 3 refills | Status: DC

## 2023-09-26 NOTE — Progress Notes (Signed)
 09/26/2023 1:47 PM   Collin Robertson 1956/01/03 [id number]  Referring provider: Henriette Anes, DO [address] [place],  [place] [address]  Followup nephrolithiasis   HPI: Collin Robertson is a 67yo here for followup for nephrolithiasis. No stone events since last visit. KUB from today shows stable bilateral renal calculi. He denies any worsening pain. He denies any worsening LUTS. No other complaints today   PMH: Past Medical History:  Diagnosis Date   Acid reflux    Acute on chronic respiratory failure with hypoxia (HCC)    Acute respiratory distress syndrome (ARDS) due to COVID-19 virus (HCC)    Anxiety    Colitis    COVID-19 virus infection    Diabetes mellitus without complication (HCC)    DVT (deep venous thrombosis) (HCC)    Hypertension    Sleep apnea    Ventilator associated pneumonia [facility])     Surgical History: Past Surgical History:  Procedure Laterality Date   EXTRACORPOREAL SHOCK WAVE LITHOTRIPSY Left 12/19/2022   Procedure: EXTRACORPOREAL SHOCK WAVE LITHOTRIPSY (ESWL);  Surgeon: Sherrilee Belvie CROME, MD;  Location: [facility];  Service: Urology;  Laterality: Left;   EXTRACORPOREAL SHOCK WAVE LITHOTRIPSY Left 01/09/2023   Procedure: EXTRACORPOREAL SHOCK WAVE LITHOTRIPSY (ESWL);  Surgeon: Sherrilee Belvie CROME, MD;  Location: [facility];  Service: Urology;  Laterality: Left;    Home Medications:  Allergies as of 09/26/2023       Reactions   Codeine Hives, Itching, Rash, Swelling   Hydrocodone Itching        Medication List        Accurate as of September 26, 2023  1:47 PM. If you have any questions, ask your nurse or doctor.          STOP taking these medications    ondansetron  4 MG tablet Commonly known as: Zofran  Stopped by: Belvie Sherrilee   spironolactone 25 MG tablet Commonly known as: ALDACTONE Stopped by: Belvie Sherrilee       TAKE these medications    amLODipine 10 MG tablet Commonly known as: NORVASC Take 10 mg by mouth daily.    cyanocobalamin 1000 MCG tablet Commonly known as: VITAMIN B12 Take by mouth.   D 1000 25 MCG (1000 UT) capsule Generic drug: Cholecalciferol Take 1 capsule by mouth daily.   famotidine 20 MG tablet Commonly known as: PEPCID Take 20 mg by mouth daily.   metoprolol tartrate 25 MG tablet Commonly known as: LOPRESSOR Take 25 mg by mouth 2 (two) times daily.   oxyCODONE -acetaminophen  5-325 MG tablet Commonly known as: Percocet Take 1 tablet by mouth every 4 (four) hours as needed.   pregabalin 50 MG capsule Commonly known as: LYRICA Take 1 capsule by mouth 2 (two) times daily.   tamsulosin  0.4 MG Caps capsule Commonly known as: FLOMAX  TAKE 1 CAPSULE BY MOUTH EVERY DAY        Allergies:  Allergies  Allergen Reactions   Codeine Hives, Itching, Rash and Swelling   Hydrocodone Itching    Family History: No family history on file.  Social History:  reports that he has never smoked. He has never used smokeless tobacco. He reports that he does not currently use alcohol. He reports that he does not use drugs.  ROS: All other review of systems were reviewed and are negative except what is noted above in HPI  Physical Exam: BP (!) 161/93   Pulse 78   Constitutional:  Alert and oriented, No acute distress. HEENT: [place] AT, moist mucus membranes.  Trachea  midline, no masses. Cardiovascular: No clubbing, cyanosis, or edema. Respiratory: Normal respiratory effort, no increased work of breathing. GI: Abdomen is soft, nontender, nondistended, no abdominal masses GU: No CVA tenderness.  Lymph: No cervical or inguinal lymphadenopathy. Skin: No rashes, bruises or suspicious lesions. Neurologic: Grossly intact, no focal deficits, moving all 4 extremities. Psychiatric: Normal mood and affect.  Laboratory Data: Lab Results  Component Value Date   WBC 12.5 (H) 11/16/2020   HGB 11.8 (L) 11/16/2020   HCT 36.4 (L) 11/16/2020   MCV 87.7 11/16/2020   PLT 282 11/16/2020    Lab  Results  Component Value Date   CREATININE 0.71 11/16/2020    No results found for: PSA  No results found for: TESTOSTERONE  Lab Results  Component Value Date   HGBA1C 8.2 (H) 10/25/2020    Urinalysis    Component Value Date/Time   COLORURINE YELLOW 11/02/2020 0900   APPEARANCEUR Clear 03/20/2023 1423   LABSPEC 1.006 11/02/2020 0900   PHURINE 7.0 11/02/2020 0900   GLUCOSEU Negative 03/20/2023 1423   HGBUR LARGE (A) 11/02/2020 0900   BILIRUBINUR Negative 03/20/2023 1423   KETONESUR NEGATIVE 11/02/2020 0900   PROTEINUR Trace 03/20/2023 1423   PROTEINUR 30 (A) 11/02/2020 0900   NITRITE Negative 03/20/2023 1423   NITRITE NEGATIVE 11/02/2020 0900   LEUKOCYTESUR 2+ (A) 03/20/2023 1423   LEUKOCYTESUR SMALL (A) 11/02/2020 0900    Lab Results  Component Value Date   LABMICR See below: 03/20/2023   WBCUA 11-30 (A) 03/20/2023   LABEPIT 0-10 03/20/2023   BACTERIA Few (A) 03/20/2023    Pertinent Imaging: KUb today: images reviewed and discussed with the patient  Results for orders placed during the hospital encounter of 01/09/23  DG Abd 1 View  Narrative CLINICAL DATA:  74034 Kidney stone [id number]  EXAM: ABDOMEN - 1 VIEW  COMPARISON:  KUB, most recently 12/19/2022.  CT [facility], 12/09/2022.  FINDINGS: Nonobstructed bowel-gas pattern.  Similar size and distribution of BILATERAL nephrolithiasis, with largest lesion on the RIGHT measuring approximately 1.9 cm. Additional scattered smaller nephroliths. Pelvic phleboliths.  No interval osseous abnormality.  IMPRESSION: Similar size and distribution of BILATERAL renal calculi.   Electronically Signed By: Thom Hall M.D. On: 01/09/2023 08:17  No results found for this or any previous visit.  No results found for this or any previous visit.  No results found for this or any previous visit.  Results for orders placed during the hospital encounter of 02/28/23  Ultrasound renal complete  Narrative CLINICAL  DATA:  Kidney stone follow-up.  EXAM: RENAL / URINARY TRACT ULTRASOUND COMPLETE  COMPARISON:  CT abdomen and pelvis December 09, 2022  FINDINGS: Right Kidney:  Renal measurements: 10.6 x 6.4 x 6.5 cm = volume: 228.9 mL. Several kidney stones are identified, largest measures 2.1 cm. No mass or hydronephrosis visualized.  Left Kidney:  Renal measurements: 10 x 6.2 x 5.3 cm = volume: 171.2 mL. Several kidney stones are identified, largest measures 1.5 cm. No mass or hydronephrosis visualized.  Bladder:  The bladder is not well distended, limiting evaluation. Left ureteral jet is visualized.  Other:  None.  IMPRESSION: Bilateral nephrolithiasis. No hydronephrosis bilaterally.   Electronically Signed By: Craig Farr M.D. On: 02/28/2023 10:26  No results found for this or any previous visit.  No results found for this or any previous visit.  No results found for this or any previous visit.   Assessment & Plan:    1. Kidney stones (Primary) Continue surveillance. Followup 6 months  with KUB - Urinalysis, Routine w reflex microscopic   No follow-ups on file.  Belvie Clara, MD  [facility] [place]

## 2023-10-01 ENCOUNTER — Encounter: Payer: Self-pay | Admitting: Urology

## 2023-10-01 NOTE — Patient Instructions (Signed)

## 2024-03-26 ENCOUNTER — Ambulatory Visit: Payer: Medicare Other | Admitting: Urology

## 2024-05-12 ENCOUNTER — Encounter: Payer: Self-pay | Admitting: Urology

## 2024-05-12 ENCOUNTER — Ambulatory Visit (HOSPITAL_COMMUNITY)
Admission: RE | Admit: 2024-05-12 | Discharge: 2024-05-12 | Disposition: A | Discharge status: Home / Self Care | Source: Ambulatory Visit | Attending: Urology | Admitting: Urology

## 2024-05-12 ENCOUNTER — Ambulatory Visit: Admitting: Urology

## 2024-05-12 VITALS — BP 150/90 | HR 73

## 2024-05-12 DIAGNOSIS — N200 Calculus of kidney: Secondary | ICD-10-CM | POA: Diagnosis present

## 2024-05-12 LAB — MICROSCOPIC EXAMINATION: WBC, UA: >30 /HPF — AB (ref 0–5)

## 2024-05-12 LAB — URINALYSIS, ROUTINE W REFLEX MICROSCOPIC
Bilirubin, UA: NEGATIVE
Ketones, UA: NEGATIVE
Nitrite, UA: NEGATIVE
Protein,UA: NEGATIVE
Specific Gravity, UA: 1.02 (ref 1.005–1.030)
Urobilinogen, Ur: 0.2 mg/dL (ref 0.2–1.0)
pH, UA: 6 (ref 5.0–7.5)

## 2024-05-12 MED ORDER — TAMSULOSIN HCL 0.4 MG PO CAPS: 0.4000 mg | ORAL_CAPSULE | Freq: Every day | ORAL | 3 refills | Status: AC

## 2024-05-12 NOTE — Patient Instructions (Signed)

## 2024-05-12 NOTE — Progress Notes (Signed)
 05/12/2024 2:28 PM   Collin Robertson [birth year] [id number]  Referring provider: Henriette Anes, DO [address] [place],  [place] [id number]  No chief complaint on file.   HPI: Collin Robertson is a 68yo here for followup for nephrolithiasis. No stone events since last visit. He denies nay flank pain. KUb shows stable bilateral renal calculi. He has increased water and lemonade intake. IPSS 8 QOL 3 on flomax  0.4mg  daily. He was started farxiga. He does have urinary frequency and urgency.    PMH: Past Medical History:  Diagnosis Date   Acid reflux    Acute on chronic respiratory failure with hypoxia (HCC)    Acute respiratory distress syndrome (ARDS) due to COVID-19 virus (HCC)    Anxiety    Colitis    COVID-19 virus infection    Diabetes mellitus without complication (HCC)    DVT (deep venous thrombosis) (HCC)    Hypertension    Sleep apnea    Ventilator associated pneumonia [facility])     Surgical History: Past Surgical History:  Procedure Laterality Date   EXTRACORPOREAL SHOCK WAVE LITHOTRIPSY Left 12/19/2022   Procedure: EXTRACORPOREAL SHOCK WAVE LITHOTRIPSY (ESWL);  Surgeon: Collin Belvie CROME, MD;  Location: [facility];  Service: Urology;  Laterality: Left;   EXTRACORPOREAL SHOCK WAVE LITHOTRIPSY Left 01/09/2023   Procedure: EXTRACORPOREAL SHOCK WAVE LITHOTRIPSY (ESWL);  Surgeon: Collin Belvie CROME, MD;  Location: [facility];  Service: Urology;  Laterality: Left;    Home Medications:  Allergies as of 05/12/2024       Reactions   Codeine Hives, Itching, Rash, Swelling   Hydrocodone Itching        Medication List        Accurate as of May 12, 2024  2:28 PM. If you have any questions, ask your nurse or doctor.          amLODipine 10 MG tablet Commonly known as: NORVASC Take 10 mg by mouth daily.   cyanocobalamin 1000 MCG tablet Commonly known as: VITAMIN B12 Take by mouth.   D 1000 25 MCG (1000 UT) capsule Generic drug: Cholecalciferol Take 1 capsule by mouth  daily.   famotidine 20 MG tablet Commonly known as: PEPCID Take 20 mg by mouth daily.   metoprolol tartrate 25 MG tablet Commonly known as: LOPRESSOR Take 25 mg by mouth 2 (two) times daily.   oxyCODONE -acetaminophen  5-325 MG tablet Commonly known as: Percocet Take 1 tablet by mouth every 4 (four) hours as needed.   pregabalin 50 MG capsule Commonly known as: LYRICA Take 1 capsule by mouth 2 (two) times daily.   tamsulosin  0.4 MG Caps capsule Commonly known as: FLOMAX  Take 1 capsule (0.4 mg total) by mouth daily.        Allergies:  Allergies  Allergen Reactions   Codeine Hives, Itching, Rash and Swelling   Hydrocodone Itching    Family History: No family history on file.  Social History:  reports that he has never smoked. He has never used smokeless tobacco. He reports that he does not currently use alcohol. He reports that he does not use drugs.  ROS: All other review of systems were reviewed and are negative except what is noted above in HPI  Physical Exam: BP (!) 150/90   Pulse 73   Constitutional:  Alert and oriented, No acute distress. HEENT: [place] AT, moist mucus membranes.  Trachea midline, no masses. Cardiovascular: No clubbing, cyanosis, or edema. Respiratory: Normal respiratory effort, no increased work of breathing. GI: Abdomen is soft, nontender, nondistended,  no abdominal masses GU: No CVA tenderness.  Lymph: No cervical or inguinal lymphadenopathy. Skin: No rashes, bruises or suspicious lesions. Neurologic: Grossly intact, no focal deficits, moving all 4 extremities. Psychiatric: Normal mood and affect.  Laboratory Data: Lab Results  Component Value Date   WBC 12.5 (H) 11/16/2020   HGB 11.8 (L) 11/16/2020   HCT 36.4 (L) 11/16/2020   MCV 87.7 11/16/2020   PLT 282 11/16/2020    Lab Results  Component Value Date   CREATININE 0.71 11/16/2020    No results found for: PSA  No results found for: TESTOSTERONE  Lab Results  Component  Value Date   HGBA1C 8.2 (H) 10/25/2020    Urinalysis    Component Value Date/Time   COLORURINE YELLOW 11/02/2020 0900   APPEARANCEUR Clear 09/26/2023 1331   LABSPEC 1.006 11/02/2020 0900   PHURINE 7.0 11/02/2020 0900   GLUCOSEU Negative 09/26/2023 1331   HGBUR LARGE (A) 11/02/2020 0900   BILIRUBINUR Negative 09/26/2023 1331   KETONESUR NEGATIVE 11/02/2020 0900   PROTEINUR Trace 09/26/2023 1331   PROTEINUR 30 (A) 11/02/2020 0900   NITRITE Negative 09/26/2023 1331   NITRITE NEGATIVE 11/02/2020 0900   LEUKOCYTESUR 2+ (A) 09/26/2023 1331   LEUKOCYTESUR SMALL (A) 11/02/2020 0900    Lab Results  Component Value Date   LABMICR See below: 09/26/2023   WBCUA 11-30 (A) 09/26/2023   LABEPIT 0-10 09/26/2023   BACTERIA Few (A) 09/26/2023    Pertinent Imaging: KUb today: Images reviewed and discussed with the patient  Results for orders placed during the hospital encounter of 09/26/23  DG Abd 1 View  Narrative CLINICAL DATA:  Right-sided flank pain  EXAM: ABDOMEN - 1 VIEW  COMPARISON:  01/29/2023  FINDINGS: Nonobstructed gas pattern. Multiple bilateral kidney stones, on the right largest stone measures 2 cm. On the left, largest stone measures 11 mm. Multiple pelvic calcifications unchanged and likely due to phleboliths.  IMPRESSION: Multiple bilateral kidney stones.   Electronically Signed By: Collin Robertson M.D. On: 10/10/2023 22:32  No results found for this or any previous visit.  No results found for this or any previous visit.  No results found for this or any previous visit.  Results for orders placed during the hospital encounter of 02/28/23  Ultrasound renal complete  Narrative CLINICAL DATA:  Kidney stone follow-up.  EXAM: RENAL / URINARY TRACT ULTRASOUND COMPLETE  COMPARISON:  CT abdomen and pelvis December 09, 2022  FINDINGS: Right Kidney:  Renal measurements: 10.6 x 6.4 x 6.5 cm = volume: 228.9 mL. Several kidney stones are identified,  largest measures 2.1 cm. No mass or hydronephrosis visualized.  Left Kidney:  Renal measurements: 10 x 6.2 x 5.3 cm = volume: 171.2 mL. Several kidney stones are identified, largest measures 1.5 cm. No mass or hydronephrosis visualized.  Bladder:  The bladder is not well distended, limiting evaluation. Left ureteral jet is visualized.  Other:  None.  IMPRESSION: Bilateral nephrolithiasis. No hydronephrosis bilaterally.   Electronically Signed By: Craig Farr M.D. On: 02/28/2023 10:26  No results found for this or any previous visit.  No results found for this or any previous visit.  No results found for this or any previous visit.   Assessment & Plan:    1. Nephrolithiasis (Primary) -followup 1 year with KUB - Urinalysis, Routine w reflex microscopic   No follow-ups on file.  Belvie Clara, MD  [facility] [place]

## 2025-05-13 ENCOUNTER — Ambulatory Visit: Admitting: Urology
